# Patient Record
Sex: Female | Born: 1940 | Race: White | Hispanic: No | Smoking: Current every day smoker
Health system: Southern US, Community
[De-identification: ages and names within clinical notes are randomized; demographics above are authoritative.]

## PROBLEM LIST (undated history)

## (undated) DIAGNOSIS — I10 Essential (primary) hypertension: Secondary | ICD-10-CM

## (undated) DIAGNOSIS — E039 Hypothyroidism, unspecified: Secondary | ICD-10-CM

## (undated) DIAGNOSIS — I119 Hypertensive heart disease without heart failure: Secondary | ICD-10-CM

## (undated) DIAGNOSIS — I480 Paroxysmal atrial fibrillation: Secondary | ICD-10-CM

## (undated) DIAGNOSIS — I739 Peripheral vascular disease, unspecified: Secondary | ICD-10-CM

## (undated) DIAGNOSIS — E785 Hyperlipidemia, unspecified: Secondary | ICD-10-CM

## (undated) DIAGNOSIS — Z8719 Personal history of other diseases of the digestive system: Secondary | ICD-10-CM

## (undated) DIAGNOSIS — F419 Anxiety disorder, unspecified: Secondary | ICD-10-CM

## (undated) DIAGNOSIS — E119 Type 2 diabetes mellitus without complications: Secondary | ICD-10-CM

## (undated) HISTORY — DX: Hypertensive heart disease without heart failure: I11.9

## (undated) HISTORY — DX: Peripheral vascular disease, unspecified: I73.9

## (undated) HISTORY — PX: APPENDECTOMY: SHX54

## (undated) HISTORY — PX: RENAL ARTERY STENT: SHX2321

## (undated) HISTORY — PX: TOTAL KNEE ARTHROPLASTY: SHX125

## (undated) HISTORY — PX: ABDOMINAL HYSTERECTOMY: SHX81

## (undated) HISTORY — DX: Hyperlipidemia, unspecified: E78.5

## (undated) HISTORY — PX: CATARACT EXTRACTION W/ INTRAOCULAR LENS  IMPLANT, BILATERAL: SHX1307

## (undated) HISTORY — PX: JOINT REPLACEMENT: SHX530

---

## 1998-05-20 ENCOUNTER — Ambulatory Visit (HOSPITAL_COMMUNITY): Admission: RE | Admit: 1998-05-20 | Discharge: 1998-05-20 | Payer: Self-pay | Admitting: *Deleted

## 1998-09-24 ENCOUNTER — Emergency Department (HOSPITAL_COMMUNITY): Admission: EM | Admit: 1998-09-24 | Discharge: 1998-09-24 | Payer: Self-pay | Admitting: *Deleted

## 1999-01-12 ENCOUNTER — Ambulatory Visit (HOSPITAL_COMMUNITY): Admission: RE | Admit: 1999-01-12 | Discharge: 1999-01-12 | Payer: Self-pay | Admitting: Internal Medicine

## 1999-01-12 ENCOUNTER — Encounter: Payer: Self-pay | Admitting: Internal Medicine

## 2000-07-13 ENCOUNTER — Encounter: Payer: Self-pay | Admitting: *Deleted

## 2000-07-13 ENCOUNTER — Encounter: Admission: RE | Admit: 2000-07-13 | Discharge: 2000-07-13 | Payer: Self-pay | Admitting: *Deleted

## 2001-04-09 ENCOUNTER — Ambulatory Visit (HOSPITAL_COMMUNITY): Admission: RE | Admit: 2001-04-09 | Discharge: 2001-04-09 | Payer: Self-pay | Admitting: Internal Medicine

## 2001-04-09 ENCOUNTER — Encounter: Payer: Self-pay | Admitting: Internal Medicine

## 2001-07-03 ENCOUNTER — Ambulatory Visit (HOSPITAL_COMMUNITY): Admission: RE | Admit: 2001-07-03 | Discharge: 2001-07-03 | Payer: Self-pay | Admitting: *Deleted

## 2001-07-03 ENCOUNTER — Encounter (INDEPENDENT_AMBULATORY_CARE_PROVIDER_SITE_OTHER): Payer: Self-pay | Admitting: Specialist

## 2002-05-28 ENCOUNTER — Encounter: Admission: RE | Admit: 2002-05-28 | Discharge: 2002-08-26 | Payer: Self-pay | Admitting: Internal Medicine

## 2003-07-07 ENCOUNTER — Encounter: Admission: RE | Admit: 2003-07-07 | Discharge: 2003-07-07 | Payer: Self-pay | Admitting: Internal Medicine

## 2004-12-20 ENCOUNTER — Ambulatory Visit (HOSPITAL_COMMUNITY): Admission: RE | Admit: 2004-12-20 | Discharge: 2004-12-20 | Payer: Self-pay | Admitting: Cardiology

## 2005-01-03 ENCOUNTER — Observation Stay (HOSPITAL_COMMUNITY): Admission: RE | Admit: 2005-01-03 | Discharge: 2005-01-04 | Payer: Self-pay | Admitting: Interventional Cardiology

## 2006-03-13 ENCOUNTER — Encounter: Admission: RE | Admit: 2006-03-13 | Discharge: 2006-03-13 | Payer: Self-pay | Admitting: Internal Medicine

## 2006-04-24 ENCOUNTER — Emergency Department (HOSPITAL_COMMUNITY): Admission: EM | Admit: 2006-04-24 | Discharge: 2006-04-24 | Payer: Self-pay | Admitting: Emergency Medicine

## 2007-04-30 ENCOUNTER — Encounter: Admission: RE | Admit: 2007-04-30 | Discharge: 2007-04-30 | Payer: Self-pay | Admitting: Internal Medicine

## 2007-11-18 ENCOUNTER — Encounter: Admission: RE | Admit: 2007-11-18 | Discharge: 2007-11-18 | Payer: Self-pay | Admitting: Orthopedic Surgery

## 2007-11-28 ENCOUNTER — Ambulatory Visit (HOSPITAL_BASED_OUTPATIENT_CLINIC_OR_DEPARTMENT_OTHER): Admission: RE | Admit: 2007-11-28 | Discharge: 2007-11-28 | Payer: Self-pay | Admitting: Orthopedic Surgery

## 2008-05-13 ENCOUNTER — Encounter: Admission: RE | Admit: 2008-05-13 | Discharge: 2008-05-13 | Payer: Self-pay | Admitting: Internal Medicine

## 2008-07-03 ENCOUNTER — Encounter: Admission: RE | Admit: 2008-07-03 | Discharge: 2008-07-03 | Payer: Self-pay | Admitting: Internal Medicine

## 2009-06-04 ENCOUNTER — Encounter: Admission: RE | Admit: 2009-06-04 | Discharge: 2009-06-04 | Payer: Self-pay | Admitting: Internal Medicine

## 2009-08-23 ENCOUNTER — Inpatient Hospital Stay (HOSPITAL_COMMUNITY): Admission: RE | Admit: 2009-08-23 | Discharge: 2009-08-25 | Payer: Self-pay | Admitting: Orthopedic Surgery

## 2009-09-15 ENCOUNTER — Encounter: Admission: RE | Admit: 2009-09-15 | Discharge: 2009-12-13 | Payer: Self-pay | Admitting: Orthopedic Surgery

## 2009-10-26 ENCOUNTER — Encounter: Admission: RE | Admit: 2009-10-26 | Discharge: 2009-12-13 | Payer: Self-pay | Admitting: Orthopedic Surgery

## 2010-04-24 ENCOUNTER — Encounter: Payer: Self-pay | Admitting: Internal Medicine

## 2010-05-18 ENCOUNTER — Other Ambulatory Visit: Payer: Self-pay | Admitting: Internal Medicine

## 2010-05-18 DIAGNOSIS — Z1231 Encounter for screening mammogram for malignant neoplasm of breast: Secondary | ICD-10-CM

## 2010-06-06 ENCOUNTER — Ambulatory Visit
Admission: RE | Admit: 2010-06-06 | Discharge: 2010-06-06 | Disposition: A | Discharge status: Home / Self Care | Payer: Medicare Other | Source: Ambulatory Visit | Attending: Internal Medicine | Admitting: Internal Medicine

## 2010-06-06 DIAGNOSIS — Z1231 Encounter for screening mammogram for malignant neoplasm of breast: Secondary | ICD-10-CM

## 2010-06-20 LAB — DIFFERENTIAL
Eosinophils Absolute: 0.1 10*3/uL (ref 0.0–0.7)
Eosinophils Relative: 1 % (ref 0–5)
Lymphocytes Relative: 24 % (ref 12–46)
Lymphs Abs: 2.2 10*3/uL (ref 0.7–4.0)
Monocytes Relative: 5 % (ref 3–12)

## 2010-06-20 LAB — CBC
HCT: 30.8 % — ABNORMAL LOW (ref 36.0–46.0)
HCT: 37.9 % (ref 36.0–46.0)
Hemoglobin: 13 g/dL (ref 12.0–15.0)
MCHC: 34.4 g/dL (ref 30.0–36.0)
MCHC: 34.8 g/dL (ref 30.0–36.0)
MCV: 96.3 fL (ref 78.0–100.0)
Platelets: 109 10*3/uL — ABNORMAL LOW (ref 150–400)
Platelets: 68 10*3/uL — ABNORMAL LOW (ref 150–400)
RBC: 3.94 MIL/uL (ref 3.87–5.11)
RDW: 12.7 % (ref 11.5–15.5)
RDW: 12.8 % (ref 11.5–15.5)
WBC: 10 10*3/uL (ref 4.0–10.5)
WBC: 10.3 10*3/uL (ref 4.0–10.5)

## 2010-06-20 LAB — BASIC METABOLIC PANEL
BUN: 14 mg/dL (ref 6–23)
CO2: 27 mEq/L (ref 19–32)
Chloride: 105 mEq/L (ref 96–112)
Creatinine, Ser: 0.93 mg/dL (ref 0.4–1.2)
Creatinine, Ser: 0.99 mg/dL (ref 0.4–1.2)
GFR calc Af Amer: >60 mL/min (ref >60)
GFR calc non Af Amer: 60 mL/min — ABNORMAL LOW (ref >60)
Glucose, Bld: 142 mg/dL — ABNORMAL HIGH (ref 70–99)
Potassium: 3.5 mEq/L (ref 3.5–5.1)
Potassium: 3.5 mEq/L (ref 3.5–5.1)

## 2010-06-20 LAB — CROSSMATCH
ABO/RH(D): A POS
Antibody Screen: NEGATIVE

## 2010-06-20 LAB — URINALYSIS, ROUTINE W REFLEX MICROSCOPIC
Bilirubin Urine: NEGATIVE
Bilirubin Urine: NEGATIVE
Bilirubin Urine: NEGATIVE
Glucose, UA: NEGATIVE mg/dL
Glucose, UA: NEGATIVE mg/dL
Glucose, UA: NEGATIVE mg/dL
Hgb urine dipstick: NEGATIVE
Hgb urine dipstick: NEGATIVE
Ketones, ur: NEGATIVE mg/dL
Ketones, ur: NEGATIVE mg/dL
Nitrite: NEGATIVE
Nitrite: NEGATIVE
Nitrite: POSITIVE — AB
Protein, ur: 100 mg/dL — AB
Protein, ur: NEGATIVE mg/dL
Specific Gravity, Urine: 1.006 (ref 1.005–1.030)
Specific Gravity, Urine: 1.009 (ref 1.005–1.030)
Specific Gravity, Urine: 1.023 (ref 1.005–1.030)
Urobilinogen, UA: 0.2 mg/dL (ref 0.0–1.0)
Urobilinogen, UA: 0.2 mg/dL (ref 0.0–1.0)
pH: 6 (ref 5.0–8.0)
pH: 6.5 (ref 5.0–8.0)
pH: 7 (ref 5.0–8.0)

## 2010-06-20 LAB — COMPREHENSIVE METABOLIC PANEL
ALT: 21 U/L (ref 0–35)
AST: 26 U/L (ref 0–37)
Calcium: 10.6 mg/dL — ABNORMAL HIGH (ref 8.4–10.5)
GFR calc Af Amer: >60 mL/min (ref >60)
Sodium: 137 mEq/L (ref 135–145)
Total Protein: 7.5 g/dL (ref 6.0–8.3)

## 2010-06-20 LAB — GLUCOSE, CAPILLARY
Glucose-Capillary: 140 mg/dL — ABNORMAL HIGH (ref 70–99)
Glucose-Capillary: 165 mg/dL — ABNORMAL HIGH (ref 70–99)
Glucose-Capillary: 206 mg/dL — ABNORMAL HIGH (ref 70–99)
Glucose-Capillary: 218 mg/dL — ABNORMAL HIGH (ref 70–99)
Glucose-Capillary: 219 mg/dL — ABNORMAL HIGH (ref 70–99)
Glucose-Capillary: 240 mg/dL — ABNORMAL HIGH (ref 70–99)
Glucose-Capillary: 248 mg/dL — ABNORMAL HIGH (ref 70–99)

## 2010-06-20 LAB — URINE CULTURE
Colony Count: >=100000
Colony Count: NO GROWTH
Culture: NO GROWTH

## 2010-06-20 LAB — URINE MICROSCOPIC-ADD ON

## 2010-06-20 LAB — ABO/RH: ABO/RH(D): A POS

## 2010-06-20 LAB — PROTIME-INR: INR: 0.98 (ref 0.00–1.49)

## 2010-08-16 NOTE — Op Note (Signed)
NAME:  Krista Arroyo, Krista Arroyo            ACCOUNT NO.:  [id number]   MEDICAL RECORD NO.:  [id number]          PATIENT TYPE:  AMB   LOCATION:  DSC                          FACILITY:  [facility]   PHYSICIAN:  Rodney A. Mortenson, M.D.DATE OF BIRTH:  December 30, 1940   DATE OF PROCEDURE:  11/28/2007  DATE OF DISCHARGE:                               OPERATIVE REPORT   JUSTIFICATION:  A 70 year old female with a 6-to 8-week history of right  knee pain.  She has pain along the medial side of the knee.  Twisting of  leg causes pain about the medial side of the tibia.  Examination shows  1+ effusion.  There is slight tenderness of the medial joint line.  No  lateral joint line tenderness.  Forced extension was quite painful.  X-  ray showed some narrowing of the lateral joint line and calcification of  the popliteal vessels.  An MRI was done that shows an extremely complex  tear of the posterior two-thirds of the lateral meniscus with a flap of  lateral meniscus in the intercondylar notch area.  Because persistent  pain and discomfort, she is now admitted for arthroscopic evaluation and  treatment.  Complications were discussed preoperatively.  Questions  answered and encouraged and the patient wished to proceed.   JUSTIFICATION OUTPATIENT SURGERY:  Minimal morbidity.   PREOPERATIVE DIAGNOSES:  Early osteoarthritis of lateral compartment,  right knee; complex tear posterior two-thirds of lateral meniscus, right  knee.   POSTOPERATIVE DIAGNOSES:  Early osteoarthritis of lateral compartment,  right knee; complex tear posterior two-thirds of lateral meniscus, right  knee; and early osteoarthritis of lateral patellar facet, right knee.   OPERATION:  Arthroscopy of right knee; subtotal lateral meniscectomy.   SURGEON:  Lenard Galloway. Chaney Malling, MD   ANESTHESIA:  MAC.   PATHOLOGY:  With the arthroscope of the knee, a very careful examination  of the knee was undertaken.  There is a fair amount of synovitis in  the  superior pouch.  The lateral patellar facet showed loss of articular  cartilage.  The femoral notch area appeared fairly normal.  The anterior  cruciate was normal, but there is a big flap of the lateral meniscus  which was jammed into the intercondylar notch area.  In the medial  compartments, there was normal articular cartilage over the medial  femoral condyle and medial tibial plateau.  There was some cyrstal  deposition and not diagnostic of gout.  In any case, the medial meniscus  was absolutely normal articular cartilage and medial compartment was  normal.  The arthroscope was then placed in the lateral compartment and  had extensive tearing of the lateral meniscus starting at the junction  of the anterior mid third and carried all way posteriorly.   PROCEDURE:  The patient was placed on the operating table in supine  position with the pneumatic tourniquet above the right upper thigh.  The  right leg was placed in a leg holder.  The entire right lower extremity  was prepped with DuraPrep and draped out in the usual manner.  An  infusion cannula was placed in the superior medial  pouch and the knee  distended with saline.  Anteromedial and anterolateral portals were made  and the arthroscope was introduced.  The findings as described above.  The only significant pathology that was treatable was in the lateral  compartment.  Through both medial and lateral portals, a series of  baskets were inserted and this was very aggressively debrided.  Once  this was debulked back to more stable meniscal tissue, the intra-  articular shaver was introduced.  All debriders were removed.  The  remaining rim was then smoothed and balanced and the posterior two-  thirds of the meniscus and about half the width had to be removed.  Transition to the anterior third was smooth.  The meniscal rim was quite  stable and the anterior third of lateral meniscus was preserved.  Marcaine was then placed and  a large bulky pressure dressing applied and  the patient returned to the recovery room in excellent condition.  Technically, this procedure went extremely well.   DISPOSITION:  1. Percocet for pain.  2. Usual postoperative instructions were given.  3. Return to my office on Wednesday for followup exam.      Thereasa Distance A. Chaney Malling, M.D.  Electronically Signed     RAM/MEDQ  D:  11/28/2007  T:  11/29/2007  Job:  [id number]

## 2010-08-19 NOTE — Discharge Summary (Signed)
NAME:  Krista Arroyo, BIRKEL            ACCOUNT NO.:  [id number]   MEDICAL RECORD NO.:  [id number]          PATIENT TYPE:  OBV   LOCATION:  6533                         FACILITY:  [facility]   PHYSICIAN:  Corky Crafts, MDDATE OF BIRTH:  1940-05-04   DATE OF ADMISSION:  01/03/2005  DATE OF DISCHARGE:  01/04/2005                                 DISCHARGE SUMMARY   ADDENDUM:  I spent 45 minutes doing this discharge.  The majority of time  spent was with the patient going over her medications and follow-up  instructions.           ______________________________  Corky Crafts, MD     JSV/MEDQ  D:  01/05/2005  T:  01/05/2005  Job:  [id number]

## 2010-08-19 NOTE — Op Note (Signed)
NAME:  Krista Arroyo, BLOMGREN NO.:  [id number]   MEDICAL RECORD NO.:  [id number]          PATIENT TYPE:  OUT   LOCATION:  VASC                         FACILITY:  [facility]   PHYSICIAN:  Corky Crafts, MDDATE OF BIRTH:  [birth year]   DATE OF PROCEDURE:  01/03/2005  DATE OF DISCHARGE:                                 OPERATIVE REPORT   PROCEDURE:  1.  Abdominal aortogram.  2.  Bilateral selective renal angiogram.  3.  Right renal artery PTCA with stent placement.   SURGEON:  Corky Crafts, M.D.  and Salvadore Farber, M.D. [facility].   INDICATIONS FOR PROCEDURE:  The patient is a 70 year old woman who is on  five medications for hypertension which is still poorly controlled.  She had  an abnormal MRA showing bilateral renal artery stenosis.   DESCRIPTION OF PROCEDURE:  The patient was brought to the peripheral  vascular lab.  The risks and alternatives of the procedure in consultation  were explained to the patient and informed consent was obtained.  The  patient was placed on the table.  The planned puncture sites were prepped  and draped in the usual sterile fashion.   1.  Left femoral artery access.  The puncture site was infiltrated with      lidocaine.  The vessel was accessed.  The wire was threaded into the      vessel and a 6 French sheath was advanced over the wire into the vessel.   1.  Abdominal aortogram.  A pigtail catheter was positioned under      fluoroscopic guidance within the abdominal aorta.  Contrast injections      were performed using power injection of contrast.  An angiogram was      obtained in the AP projection.   1.  Bilateral selective renal angiogram.  A 55 cm LIMA diagnostic catheter      was advanced through the aorta and positioned in the left renal artery      ostium under fluoroscopic guidance.  Angiography was performed using      hand injection of contrast.   1.  Right renal angiography.  A 55 cm LIMA diagnostic catheter was     positioned in the right renal ostium under fluoroscopic guidance.      Angiography was performed using hand injection of contrast.   LESION #1 INTERVENTION:  A stabilizer wire was advanced across the 95%  proximal right renal artery lesion while the 0.035 inch J-wire was also  within the guide catheter.  The lesion was crossed, the J-wire was removed,  and the guide catheter was slowly advanced into the right renal artery  vessel ostium.  The lesion was predilated with a 4.0 x 15 Aviator balloon to  7 atmospheres for 30 seconds.  There was still a significant residual  stenosis in the right renal artery.  A 4.5 mm x 12 Herculink stent was then  advanced to the lesion and inflated at 12 atmospheres for 40 seconds.  The  ostium of the stent which was hanging in the aorta slightly was then flared  with an  inflation of 8 atmospheres for 15 seconds.   The stent was then postdilated with a 6.0 x 15 mm Aviator balloon at 7  atmospheres for 30 seconds.  The patient began to experience some slight  flank pain at this level of inflation and higher pressures were not used.   HEMODYNAMICS:  Central aortic pressure was 191/84 during the procedure.   IMPRESSION:  1.  Severe atherosclerosis of the abdominal aorta.  There is also moderate      atherosclerosis of the right common iliac noted at the bifurcation which      did not seem to exceed greater than 40%. There was mild disease of the      left common iliac.  There is a dual arterial supply to the right kidney      and single artery supply to the left kidney.  2.  Selective bilateral renal angiogram shows a 95% calcified proximal      stenosis in the right renal artery supplying the upper 3/4 of the kidney      with post stenotic dilatation.  The accessory right renal artery      supplying the lower part of the kidney was patent per the abdominal      aortogram.  3.  40% ostial left renal artery lesion.  4.  Successful percutaneous  transluminal coronary angioplasty with stent      placement of the right renal artery with normal flow and a residual 10%      stenosis.   RECOMMENDATIONS:  We will continue aspirin and Plavix in this patient for at  least a month.  We will observe the patient overnight and watch her blood  pressure medicines as she may require lower dosages after this procedure.  There were no apparent complications.           ______________________________  Corky Crafts, MD     JSV/MEDQ  D:  01/03/2005  T:  01/03/2005  Job:  [id number]

## 2010-08-19 NOTE — Discharge Summary (Signed)
NAME:  Krista Arroyo, Krista Arroyo            ACCOUNT NO.:  [id number]   MEDICAL RECORD NO.:  [id number]          PATIENT TYPE:  OBV   LOCATION:  6533                         FACILITY:  [facility]   PHYSICIAN:  Corky Crafts, MDDATE OF BIRTH:  April 29, 1940   DATE OF ADMISSION:  01/03/2005  DATE OF DISCHARGE:  01/04/2005                                 DISCHARGE SUMMARY   DISCHARGE DIAGNOSES:  1.  Renal artery stenosis, status post right renal artery stent placement.  2.  Hypertension.  3.  Peripheral vascular disease.   PROCEDURES:  Abdominal aortogram, bilateral selective renal angiogram, right  renal artery percutaneous transluminal coronary angioplasty/stent.   HOSPITAL COURSE:  The patient underwent peripheral angiography with  intervention on January 03, 2005.  She had a 4.5 X 12 Herculink stent placed  in her right renal artery after angiography.  The procedure went well.  There were no apparent complications.  The patient was observed overnight.  Her blood pressures were noted to be anywhere from 140's to 150's systolic  which is a marked improvement for her.  She did not have any issues with  bleeding post procedure.  She did have a vagal response during the sheath  pull, however, this resolved quickly after 0.5 mg of Atropine.  On the  morning of the procedure the patient feels well and is ready to go home.   DISCHARGE MEDICATIONS:  1.  Glyburide 1.25 mg p.o. three times a day for today only.  Tomorrow,      January 05, 2005, she will resume her Glimetformin 1.25/250 mg p.o. three      times a day.  2.  Today she will also take Altace 10 mg p.o. twice a day.  3.  Norvasc 10 mg p.o. daily.  4.  Lipitor 40 mg p.o. once daily.  5.  Toprol XL 200 mg once daily.  6.  Aspirin 325 mg p.o. daily.  7.  Plavix 75 mg p.o. daily for one month.  8.  Will stop her Avalide at this time since he blood pressure is improved.   DISCHARGE INSTRUCTIONS:   DIET:  The patient is to follow a low salt  diet.   ACTIVITY:  She is not to drive for three days.  She is not to do any heavy  lifting for at least one week.  She is to increase her activity slowly.   FOLLOW UP:  She has a follow up appointment with Dr. Eldridge Dace in the  cardiology office on January 09, 2005 at 9:50 A.M.           ______________________________  Corky Crafts, MD     JSV/MEDQ  D:  01/04/2005  T:  01/04/2005  Job:  [id number]

## 2010-11-16 ENCOUNTER — Ambulatory Visit: Payer: Medicare Other | Attending: Orthopedic Surgery | Admitting: Physical Therapy

## 2010-11-16 DIAGNOSIS — M25559 Pain in unspecified hip: Secondary | ICD-10-CM | POA: Insufficient documentation

## 2010-11-16 DIAGNOSIS — M545 Low back pain, unspecified: Secondary | ICD-10-CM | POA: Insufficient documentation

## 2010-11-16 DIAGNOSIS — IMO0001 Reserved for inherently not codable concepts without codable children: Secondary | ICD-10-CM | POA: Insufficient documentation

## 2010-11-21 ENCOUNTER — Ambulatory Visit: Payer: Medicare Other | Admitting: Physical Therapy

## 2010-11-24 ENCOUNTER — Ambulatory Visit: Payer: Medicare Other | Admitting: Physical Therapy

## 2010-11-30 ENCOUNTER — Ambulatory Visit: Payer: Medicare Other | Admitting: Physical Therapy

## 2010-12-06 ENCOUNTER — Ambulatory Visit: Payer: Medicare Other | Attending: Orthopedic Surgery | Admitting: Physical Therapy

## 2010-12-06 DIAGNOSIS — IMO0001 Reserved for inherently not codable concepts without codable children: Secondary | ICD-10-CM | POA: Insufficient documentation

## 2010-12-06 DIAGNOSIS — M25559 Pain in unspecified hip: Secondary | ICD-10-CM | POA: Insufficient documentation

## 2010-12-06 DIAGNOSIS — M545 Low back pain, unspecified: Secondary | ICD-10-CM | POA: Insufficient documentation

## 2010-12-13 ENCOUNTER — Ambulatory Visit: Payer: Medicare Other | Admitting: Physical Therapy

## 2010-12-15 ENCOUNTER — Ambulatory Visit: Payer: Medicare Other | Admitting: Physical Therapy

## 2010-12-19 ENCOUNTER — Ambulatory Visit: Payer: Medicare Other | Admitting: Physical Therapy

## 2010-12-21 ENCOUNTER — Ambulatory Visit: Payer: Medicare Other | Admitting: Physical Therapy

## 2010-12-27 ENCOUNTER — Ambulatory Visit: Payer: Medicare Other | Admitting: Physical Therapy

## 2010-12-29 ENCOUNTER — Encounter: Payer: Medicare Other | Admitting: Physical Therapy

## 2011-01-04 ENCOUNTER — Ambulatory Visit: Payer: Medicare Other | Attending: Orthopedic Surgery | Admitting: Physical Therapy

## 2011-01-04 DIAGNOSIS — IMO0001 Reserved for inherently not codable concepts without codable children: Secondary | ICD-10-CM | POA: Insufficient documentation

## 2011-01-04 DIAGNOSIS — M25559 Pain in unspecified hip: Secondary | ICD-10-CM | POA: Insufficient documentation

## 2011-01-04 DIAGNOSIS — M545 Low back pain, unspecified: Secondary | ICD-10-CM | POA: Insufficient documentation

## 2011-06-05 ENCOUNTER — Other Ambulatory Visit: Payer: Self-pay | Admitting: Internal Medicine

## 2011-06-05 DIAGNOSIS — Z1231 Encounter for screening mammogram for malignant neoplasm of breast: Secondary | ICD-10-CM

## 2011-06-20 ENCOUNTER — Ambulatory Visit
Admission: RE | Admit: 2011-06-20 | Discharge: 2011-06-20 | Disposition: A | Discharge status: Home / Self Care | Payer: BC Managed Care – PPO | Source: Ambulatory Visit | Attending: Internal Medicine | Admitting: Internal Medicine

## 2011-06-20 DIAGNOSIS — Z1231 Encounter for screening mammogram for malignant neoplasm of breast: Secondary | ICD-10-CM

## 2011-10-11 ENCOUNTER — Other Ambulatory Visit: Payer: Self-pay | Admitting: Internal Medicine

## 2011-10-11 DIAGNOSIS — IMO0001 Reserved for inherently not codable concepts without codable children: Secondary | ICD-10-CM

## 2011-11-01 ENCOUNTER — Other Ambulatory Visit: Payer: Medicare Other

## 2011-11-15 ENCOUNTER — Ambulatory Visit
Admission: RE | Admit: 2011-11-15 | Discharge: 2011-11-15 | Disposition: A | Discharge status: Home / Self Care | Payer: Medicare Other | Source: Ambulatory Visit | Attending: Internal Medicine | Admitting: Internal Medicine

## 2011-11-15 DIAGNOSIS — IMO0001 Reserved for inherently not codable concepts without codable children: Secondary | ICD-10-CM

## 2012-02-01 ENCOUNTER — Other Ambulatory Visit: Payer: Self-pay | Admitting: Internal Medicine

## 2012-02-01 DIAGNOSIS — E049 Nontoxic goiter, unspecified: Secondary | ICD-10-CM

## 2012-02-07 ENCOUNTER — Ambulatory Visit
Admission: RE | Admit: 2012-02-07 | Discharge: 2012-02-07 | Disposition: A | Discharge status: Home / Self Care | Payer: Medicare Other | Source: Ambulatory Visit | Attending: Internal Medicine | Admitting: Internal Medicine

## 2012-02-07 DIAGNOSIS — E049 Nontoxic goiter, unspecified: Secondary | ICD-10-CM

## 2012-07-31 ENCOUNTER — Other Ambulatory Visit: Payer: Self-pay

## 2012-07-31 DIAGNOSIS — Z1231 Encounter for screening mammogram for malignant neoplasm of breast: Secondary | ICD-10-CM

## 2012-08-16 ENCOUNTER — Ambulatory Visit
Admission: RE | Admit: 2012-08-16 | Discharge: 2012-08-16 | Disposition: A | Discharge status: Home / Self Care | Payer: Medicare Other | Source: Ambulatory Visit

## 2012-08-16 DIAGNOSIS — Z1231 Encounter for screening mammogram for malignant neoplasm of breast: Secondary | ICD-10-CM

## 2013-03-21 ENCOUNTER — Other Ambulatory Visit: Payer: Self-pay | Admitting: Internal Medicine

## 2013-03-21 DIAGNOSIS — E042 Nontoxic multinodular goiter: Secondary | ICD-10-CM

## 2013-04-01 ENCOUNTER — Ambulatory Visit
Admission: RE | Admit: 2013-04-01 | Discharge: 2013-04-01 | Disposition: A | Discharge status: Home / Self Care | Payer: Medicare Other | Source: Ambulatory Visit | Attending: Internal Medicine | Admitting: Internal Medicine

## 2013-04-01 DIAGNOSIS — E042 Nontoxic multinodular goiter: Secondary | ICD-10-CM

## 2014-08-23 ENCOUNTER — Encounter (HOSPITAL_COMMUNITY): Payer: Self-pay | Admitting: *Deleted

## 2014-08-23 ENCOUNTER — Emergency Department (HOSPITAL_COMMUNITY): Payer: Medicare Other

## 2014-08-23 ENCOUNTER — Inpatient Hospital Stay: Admit: 2014-08-23 | Payer: Medicare Other | Admitting: Orthopedic Surgery

## 2014-08-23 ENCOUNTER — Inpatient Hospital Stay (HOSPITAL_COMMUNITY)
Admission: EM | Admit: 2014-08-23 | Discharge: 2014-08-24 | DRG: 603 | Disposition: A | Discharge status: Home / Self Care | Payer: Medicare Other | Attending: Internal Medicine | Admitting: Internal Medicine

## 2014-08-23 ENCOUNTER — Encounter (HOSPITAL_COMMUNITY)
Admission: EM | Disposition: A | Discharge status: Home / Self Care | Payer: Self-pay | Source: Home / Self Care | Attending: Internal Medicine

## 2014-08-23 DIAGNOSIS — N179 Acute kidney failure, unspecified: Secondary | ICD-10-CM | POA: Diagnosis present

## 2014-08-23 DIAGNOSIS — Z794 Long term (current) use of insulin: Secondary | ICD-10-CM

## 2014-08-23 DIAGNOSIS — E039 Hypothyroidism, unspecified: Secondary | ICD-10-CM | POA: Diagnosis present

## 2014-08-23 DIAGNOSIS — F17210 Nicotine dependence, cigarettes, uncomplicated: Secondary | ICD-10-CM | POA: Diagnosis present

## 2014-08-23 DIAGNOSIS — L02511 Cutaneous abscess of right hand: Secondary | ICD-10-CM

## 2014-08-23 DIAGNOSIS — L03011 Cellulitis of right finger: Secondary | ICD-10-CM | POA: Diagnosis not present

## 2014-08-23 DIAGNOSIS — E1165 Type 2 diabetes mellitus with hyperglycemia: Secondary | ICD-10-CM | POA: Diagnosis present

## 2014-08-23 DIAGNOSIS — E871 Hypo-osmolality and hyponatremia: Secondary | ICD-10-CM | POA: Diagnosis present

## 2014-08-23 DIAGNOSIS — IMO0002 Reserved for concepts with insufficient information to code with codable children: Secondary | ICD-10-CM | POA: Diagnosis present

## 2014-08-23 DIAGNOSIS — M7989 Other specified soft tissue disorders: Secondary | ICD-10-CM | POA: Diagnosis not present

## 2014-08-23 DIAGNOSIS — I10 Essential (primary) hypertension: Secondary | ICD-10-CM | POA: Diagnosis present

## 2014-08-23 DIAGNOSIS — Z79899 Other long term (current) drug therapy: Secondary | ICD-10-CM

## 2014-08-23 DIAGNOSIS — D696 Thrombocytopenia, unspecified: Secondary | ICD-10-CM | POA: Diagnosis present

## 2014-08-23 DIAGNOSIS — E860 Dehydration: Secondary | ICD-10-CM | POA: Diagnosis present

## 2014-08-23 HISTORY — PX: I&D EXTREMITY: SHX5045

## 2014-08-23 HISTORY — DX: Essential (primary) hypertension: I10

## 2014-08-23 LAB — URINALYSIS, ROUTINE W REFLEX MICROSCOPIC
Bilirubin Urine: NEGATIVE
Glucose, UA: >1000 mg/dL — AB
Hgb urine dipstick: NEGATIVE
KETONES UR: NEGATIVE mg/dL
LEUKOCYTES UA: NEGATIVE
Nitrite: NEGATIVE
Protein, ur: NEGATIVE mg/dL
Specific Gravity, Urine: 1.021 (ref 1.005–1.030)
UROBILINOGEN UA: 0.2 mg/dL (ref 0.0–1.0)
pH: 6 (ref 5.0–8.0)

## 2014-08-23 LAB — CBC WITH DIFFERENTIAL/PLATELET
BASOS ABS: 0 10*3/uL (ref 0.0–0.1)
BASOS PCT: 0 % (ref 0–1)
Eosinophils Absolute: 0.1 10*3/uL (ref 0.0–0.7)
Eosinophils Relative: 1 % (ref 0–5)
HCT: 46.7 % — ABNORMAL HIGH (ref 36.0–46.0)
Hemoglobin: 16.6 g/dL — ABNORMAL HIGH (ref 12.0–15.0)
LYMPHS PCT: 26 % (ref 12–46)
Lymphs Abs: 1.6 10*3/uL (ref 0.7–4.0)
MCH: 32.7 pg (ref 26.0–34.0)
MCHC: 35.5 g/dL (ref 30.0–36.0)
MCV: 92.1 fL (ref 78.0–100.0)
MONOS PCT: 11 % (ref 3–12)
Monocytes Absolute: 0.7 10*3/uL (ref 0.1–1.0)
NEUTROS ABS: 3.6 10*3/uL (ref 1.7–7.7)
Neutrophils Relative %: 62 % (ref 43–77)
Platelets: 78 10*3/uL — ABNORMAL LOW (ref 150–400)
RBC: 5.07 MIL/uL (ref 3.87–5.11)
RDW: 12.6 % (ref 11.5–15.5)
WBC: 6 10*3/uL (ref 4.0–10.5)

## 2014-08-23 LAB — BASIC METABOLIC PANEL
Anion gap: 13 (ref 5–15)
BUN: 27 mg/dL — AB (ref 6–20)
CO2: 21 mmol/L — ABNORMAL LOW (ref 22–32)
CREATININE: 1.23 mg/dL — AB (ref 0.44–1.00)
Calcium: 10.4 mg/dL — ABNORMAL HIGH (ref 8.9–10.3)
Chloride: 97 mmol/L — ABNORMAL LOW (ref 101–111)
GFR, EST AFRICAN AMERICAN: 49 mL/min — AB (ref >60)
GFR, EST NON AFRICAN AMERICAN: 42 mL/min — AB (ref >60)
GLUCOSE: 372 mg/dL — AB (ref 65–99)
POTASSIUM: 4.9 mmol/L (ref 3.5–5.1)
SODIUM: 131 mmol/L — AB (ref 135–145)

## 2014-08-23 LAB — URINE MICROSCOPIC-ADD ON

## 2014-08-23 SURGERY — IRRIGATION AND DEBRIDEMENT EXTREMITY
Anesthesia: General | Site: Thumb | Laterality: Right

## 2014-08-23 SURGERY — IRRIGATION AND DEBRIDEMENT EXTREMITY
Anesthesia: General | Laterality: Right

## 2014-08-23 MED ORDER — SODIUM CHLORIDE 0.9 % IV BOLUS (SEPSIS)
1000.0000 mL | Freq: Once | INTRAVENOUS | Status: AC
  Administered 2014-08-23: 1000 mL via INTRAVENOUS

## 2014-08-23 MED ORDER — PIPERACILLIN-TAZOBACTAM 3.375 G IVPB: 3.3750 g | Freq: Three times a day (TID) | INTRAVENOUS | Status: DC

## 2014-08-23 MED ORDER — FENTANYL CITRATE (PF) 250 MCG/5ML IJ SOLN
INTRAMUSCULAR | Status: AC
  Filled 2014-08-23: qty 5

## 2014-08-23 MED ORDER — SODIUM CHLORIDE 0.9 % IV SOLN
INTRAVENOUS | Status: DC | PRN
  Administered 2014-08-23: via INTRAVENOUS

## 2014-08-23 MED ORDER — PIPERACILLIN-TAZOBACTAM 3.375 G IVPB 30 MIN
3.3750 g | INTRAVENOUS | Status: DC
  Filled 2014-08-23: qty 50

## 2014-08-23 MED ORDER — SUCCINYLCHOLINE CHLORIDE 20 MG/ML IJ SOLN
INTRAMUSCULAR | Status: AC
  Filled 2014-08-23: qty 1

## 2014-08-23 MED ORDER — HYDROCODONE-ACETAMINOPHEN 5-325 MG PO TABS
2.0000 | ORAL_TABLET | Freq: Once | ORAL | Status: AC
  Administered 2014-08-23: 2 via ORAL
  Filled 2014-08-23: qty 2

## 2014-08-23 MED ORDER — MIDAZOLAM HCL 2 MG/2ML IJ SOLN
INTRAMUSCULAR | Status: AC
  Filled 2014-08-23: qty 2

## 2014-08-23 MED ORDER — VANCOMYCIN HCL IN DEXTROSE 1-5 GM/200ML-% IV SOLN
1000.0000 mg | INTRAVENOUS | Status: DC
  Filled 2014-08-23: qty 200

## 2014-08-23 MED ORDER — ONDANSETRON HCL 4 MG/2ML IJ SOLN
INTRAMUSCULAR | Status: AC
  Filled 2014-08-23: qty 2

## 2014-08-23 MED ORDER — PROPOFOL 10 MG/ML IV BOLUS
INTRAVENOUS | Status: AC
  Filled 2014-08-23: qty 20

## 2014-08-23 MED ORDER — LIDOCAINE HCL (CARDIAC) 20 MG/ML IV SOLN
INTRAVENOUS | Status: AC
  Filled 2014-08-23: qty 5

## 2014-08-23 MED ORDER — VANCOMYCIN HCL IN DEXTROSE 1-5 GM/200ML-% IV SOLN: 1000.0000 mg | INTRAVENOUS | Status: DC

## 2014-08-23 MED ORDER — ROCURONIUM BROMIDE 50 MG/5ML IV SOLN
INTRAVENOUS | Status: AC
  Filled 2014-08-23: qty 1

## 2014-08-23 SURGICAL SUPPLY — 34 items
BNDG CMPR 9X4 STRL LF SNTH (GAUZE/BANDAGES/DRESSINGS)
BNDG COHESIVE 1X5 TAN STRL LF (GAUZE/BANDAGES/DRESSINGS) ×3 IMPLANT
BNDG ESMARK 4X9 LF (GAUZE/BANDAGES/DRESSINGS) IMPLANT
BNDG GAUZE ELAST 4 BULKY (GAUZE/BANDAGES/DRESSINGS) ×3 IMPLANT
CORDS BIPOLAR (ELECTRODE) ×3 IMPLANT
COVER SURGICAL LIGHT HANDLE (MISCELLANEOUS) ×5 IMPLANT
GAUZE PACKING IODOFORM 1/4X15 (GAUZE/BANDAGES/DRESSINGS) ×3 IMPLANT
GAUZE SPONGE 4X4 12PLY STRL (GAUZE/BANDAGES/DRESSINGS) ×3 IMPLANT
GAUZE XEROFORM 1X8 LF (GAUZE/BANDAGES/DRESSINGS) ×3 IMPLANT
GLOVE BIO SURGEON STRL SZ7.5 (GLOVE) ×3 IMPLANT
GLOVE BIOGEL PI IND STRL 8 (GLOVE) ×1 IMPLANT
GLOVE BIOGEL PI INDICATOR 8 (GLOVE) ×2
GOWN STRL REUS W/ TWL LRG LVL3 (GOWN DISPOSABLE) ×1 IMPLANT
GOWN STRL REUS W/TWL LRG LVL3 (GOWN DISPOSABLE) ×3
KIT BASIN OR (CUSTOM PROCEDURE TRAY) ×3 IMPLANT
KIT ROOM TURNOVER OR (KITS) ×3 IMPLANT
NDL HYPO 25X1 1.5 SAFETY (NEEDLE) IMPLANT
NEEDLE HYPO 25X1 1.5 SAFETY (NEEDLE) IMPLANT
NS IRRIG 1000ML POUR BTL (IV SOLUTION) ×3 IMPLANT
PACK ORTHO EXTREMITY (CUSTOM PROCEDURE TRAY) ×3 IMPLANT
PAD ARMBOARD 7.5X6 YLW CONV (MISCELLANEOUS) ×6 IMPLANT
SCRUB BETADINE 4OZ XXX (MISCELLANEOUS) ×3 IMPLANT
SOLUTION BETADINE 4OZ (MISCELLANEOUS) ×3 IMPLANT
SPONGE GAUZE 4X4 12PLY STER LF (GAUZE/BANDAGES/DRESSINGS) ×3 IMPLANT
SPONGE LAP 18X18 X RAY DECT (DISPOSABLE) ×3 IMPLANT
SPONGE LAP 4X18 X RAY DECT (DISPOSABLE) ×3 IMPLANT
SUCTION FRAZIER TIP 10 FR DISP (SUCTIONS) ×3 IMPLANT
SYR CONTROL 10ML LL (SYRINGE) IMPLANT
TOWEL OR 17X24 6PK STRL BLUE (TOWEL DISPOSABLE) ×3 IMPLANT
TOWEL OR 17X26 10 PK STRL BLUE (TOWEL DISPOSABLE) ×3 IMPLANT
TUBE ANAEROBIC SPECIMEN COL (MISCELLANEOUS) IMPLANT
TUBE CONNECTING 12'X1/4 (SUCTIONS) ×1
TUBE CONNECTING 12X1/4 (SUCTIONS) ×2 IMPLANT
UNDERPAD 30X30 INCONTINENT (UNDERPADS AND DIAPERS) ×3 IMPLANT

## 2014-08-23 NOTE — Anesthesia Preprocedure Evaluation (Addendum)
Anesthesia Evaluation  Patient identified by MRN, date of birth, ID band Patient awake    Reviewed: Allergy & Precautions, NPO status , Patient's Chart, lab work & pertinent test resultsPreop documentation limited or incomplete due to emergent nature of procedure.  History of Anesthesia Complications Negative for: history of anesthetic complications  Airway Mallampati: II  TM Distance: >3 FB Neck ROM: Full    Dental  (+) Poor Dentition, Dental Advisory Given   Pulmonary COPDCurrent Smoker,  breath sounds clear to auscultation        Cardiovascular hypertension, Pt. on medications and Pt. on home beta blockers - anginaRhythm:Regular Rate:Normal     Neuro/Psych negative neurological ROS     GI/Hepatic negative GI ROS, Neg liver ROS,   Endo/Other  diabetes (glu 372), Insulin DependentHypothyroidism   Renal/GU negative Renal ROS     Musculoskeletal   Abdominal   Peds  Hematology  (+) Blood dyscrasia (plt 78K), ,   Anesthesia Other Findings   Reproductive/Obstetrics                           Anesthesia Physical Anesthesia Plan  ASA: III  Anesthesia Plan: General   Post-op Pain Management:    Induction: Intravenous  Airway Management Planned: LMA  Additional Equipment:   Intra-op Plan:   Post-operative Plan:   Informed Consent: I have reviewed the patients History and Physical, chart, labs and discussed the procedure including the risks, benefits and alternatives for the proposed anesthesia with the patient or authorized representative who has indicated his/her understanding and acceptance.   Dental advisory given  Plan Discussed with: CRNA and Surgeon  Anesthesia Plan Comments: (Plan routine monitors, GA- LMA OK)        Anesthesia Quick Evaluation

## 2014-08-23 NOTE — ED Notes (Signed)
Pt reports infected cuticle in her R thumb x 4 days, it's getting worse. Pt is a diabetic.  Swelling, drainage and redness noted to R thumb.

## 2014-08-23 NOTE — H&P (Addendum)
Krista Arroyo is an 74 y.o. female.   Chief Complaint: right thumb infection HPI: 74 yo female states she got a paronychia after getting nails done while on a cruise last week.  I&D performed by ship doctor and started on levaquin.  Has had progressive worsening of thumb.  No fevers, chills, night sweats.  No previous problem with thumb.  Diabetic with poor blood sugar control.  Past Medical History  Diagnosis Date  . Diabetes mellitus without complication   . Hypertension     Past Surgical History  Procedure Laterality Date  . Abdominal hysterectomy    . Renal stent      History reviewed. No pertinent family history. Social History:  reports that she has been smoking Cigarettes.  She has been smoking about 0.50 packs per day. She does not have any smokeless tobacco history on file. She reports that she does not drink alcohol or use illicit drugs.  Allergies:  Allergies  Allergen Reactions  . Influenza Vaccines      (Not in a hospital admission)  Results for orders placed or performed during the hospital encounter of 08/23/14 (from the past 48 hour(s))  CBC with Differential/Platelet     Status: Abnormal   Collection Time: 08/23/14  8:28 PM  Result Value Ref Range   WBC 6.0 4.0 - 10.5 K/uL   RBC 5.07 3.87 - 5.11 MIL/uL   Hemoglobin 16.6 (H) 12.0 - 15.0 g/dL   HCT 46.7 (H) 36.0 - 46.0 %   MCV 92.1 78.0 - 100.0 fL   MCH 32.7 26.0 - 34.0 pg   MCHC 35.5 30.0 - 36.0 g/dL   RDW 12.6 11.5 - 15.5 %   Platelets 78 (L) 150 - 400 K/uL    Comment: REPEATED TO VERIFY SPECIMEN CHECKED FOR CLOTS PLATELET COUNT CONFIRMED BY SMEAR    Neutrophils Relative % 62 43 - 77 %   Lymphocytes Relative 26 12 - 46 %   Monocytes Relative 11 3 - 12 %   Eosinophils Relative 1 0 - 5 %   Basophils Relative 0 0 - 1 %   Neutro Abs 3.6 1.7 - 7.7 K/uL   Lymphs Abs 1.6 0.7 - 4.0 K/uL   Monocytes Absolute 0.7 0.1 - 1.0 K/uL   Eosinophils Absolute 0.1 0.0 - 0.7 K/uL   Basophils Absolute 0.0 0.0 -  0.1 K/uL   Smear Review MORPHOLOGY UNREMARKABLE   Basic metabolic panel     Status: Abnormal   Collection Time: 08/23/14  8:28 PM  Result Value Ref Range   Sodium 131 (L) 135 - 145 mmol/L   Potassium 4.9 3.5 - 5.1 mmol/L   Chloride 97 (L) 101 - 111 mmol/L   CO2 21 (L) 22 - 32 mmol/L   Glucose, Bld 372 (H) 65 - 99 mg/dL   BUN 27 (H) 6 - 20 mg/dL   Creatinine, Ser 1.23 (H) 0.44 - 1.00 mg/dL   Calcium 10.4 (H) 8.9 - 10.3 mg/dL   GFR calc non Af Amer 42 (L) >60 mL/min   GFR calc Af Amer 49 (L) >60 mL/min    Comment: (NOTE) The eGFR has been calculated using the CKD EPI equation. This calculation has not been validated in all clinical situations. eGFR's persistently <60 mL/min signify possible Chronic Kidney Disease.    Anion gap 13 5 - 15  Urinalysis, Routine w reflex microscopic     Status: Abnormal   Collection Time: 08/23/14  9:15 PM  Result Value Ref Range  Color, Urine YELLOW YELLOW   APPearance CLEAR CLEAR   Specific Gravity, Urine 1.021 1.005 - 1.030   pH 6.0 5.0 - 8.0   Glucose, UA >1000 (A) NEGATIVE mg/dL   Hgb urine dipstick NEGATIVE NEGATIVE   Bilirubin Urine NEGATIVE NEGATIVE   Ketones, ur NEGATIVE NEGATIVE mg/dL   Protein, ur NEGATIVE NEGATIVE mg/dL   Urobilinogen, UA 0.2 0.0 - 1.0 mg/dL   Nitrite NEGATIVE NEGATIVE   Leukocytes, UA NEGATIVE NEGATIVE  Urine microscopic-add on     Status: None   Collection Time: 08/23/14  9:15 PM  Result Value Ref Range   Squamous Epithelial / LPF RARE RARE    Dg Finger Thumb Right  08/23/2014   CLINICAL DATA:  Distal thumb pain and swelling.  History of I&D.  EXAM: RIGHT THUMB 2+V  COMPARISON:  None.  FINDINGS: There is mild diffuse soft tissue swelling about the palmar aspect of the distal phalanx of the thumb. This finding is associated with an apparent small soft tissue laceration. No subcutaneous emphysema. No radiopaque foreign body. No discrete area of osteolysis to suggest osteomyelitis.  No fracture or dislocation.  Mild-to-moderate degenerative change of the IP and MCP joints of the thumb.  IMPRESSION: Soft tissue swelling and apparent soft tissue laceration about the palmar aspect of the distal phalanx of the thumb without associated radiopaque foreign body or evidence of osteomyelitis.   Electronically Signed   By: Sandi Mariscal M.D.   On: 08/23/2014 21:11     A comprehensive review of systems was negative.  Blood pressure 135/65, pulse 77, temperature 98.5 F (36.9 C), temperature source Oral, resp. rate 16, height 5' 4" (1.626 m), weight 70.308 kg (155 lb), SpO2 95 %.  General appearance: alert, cooperative and appears stated age Head: Normocephalic, without obvious abnormality, atraumatic Neck: supple, symmetrical, trachea midline Resp: clear to auscultation bilaterally Cardio: regular rate and rhythm GI: non tender Extremities: intact sensation and capillary refill all digits.  +epl/fpl/io.  right thumb with wound on ulnar side.  fluctuance and swelling of pad.  no tenderness over proximal phalanx.  no active drainage.  patient notes infection started at nail edge on ulnar side.  no proximal streaking. Pulses: 2+ and symmetric Skin: Skin color, texture, turgor normal. No rashes or lesions Neurologic: Grossly normal Incision/Wound: As above  Assessment/Plan Right thumb felon and paronychia.  Recommend OR for incision and drainage.  Risks, benefits, and alternatives of surgery were discussed and the patient agrees with the plan of care.  Hospitalist to admit for observation and blood sugar control.  Shaquella Stamant R 08/23/2014, 11:52 PM

## 2014-08-23 NOTE — Progress Notes (Signed)
ANTIBIOTIC CONSULT NOTE - INITIAL  Pharmacy Consult for Vancomycin & Zosyn Indication: Wound infection  Allergies  Allergen Reactions  . Influenza Vaccines     Patient Measurements: Height: 5\' 4"  (162.6 cm) Weight: 155 lb (70.308 kg) IBW/kg (Calculated) : 54.7  Vital Signs: Temp: 98.5 F (36.9 C) (05/22 2118) Temp Source: Oral (05/22 2118) BP: 148/70 mmHg (05/22 2118) Pulse Rate: 87 (05/22 2118) Intake/Output from previous day:   Intake/Output from this shift:    Labs:  Recent Labs  08/23/14 2028  WBC 6.0  HGB 16.6*  PLT 78*  CREATININE 1.23*   Estimated Creatinine Clearance: 39.2 mL/min (by C-G formula based on Cr of 1.23). No results for input(s): VANCOTROUGH, VANCOPEAK, VANCORANDOM, GENTTROUGH, GENTPEAK, GENTRANDOM, TOBRATROUGH, TOBRAPEAK, TOBRARND, AMIKACINPEAK, AMIKACINTROU, AMIKACIN in the last 72 hours.   Microbiology: No results found for this or any previous visit (from the past 720 hour(s)).  Medical History: Past Medical History  Diagnosis Date  . Diabetes mellitus without complication   . Hypertension     Medications:  Scheduled:   Infusions:  . piperacillin-tazobactam    . [START ON 08/24/2014] piperacillin-tazobactam (ZOSYN)  IV    . vancomycin    . [START ON 08/24/2014] vancomycin     Assessment:  [age] yr female with diabetes presents with infected cuticle on thumb. Drainage, swelling and redness noted.  Exam shows no osteomyelitis.  Pharmacy consulted to dose Vancomycin & Zosyn for wound infection  CrCl ~ 39 ml/min  [facility]  Goal of Therapy:  Vancomycin trough level 15-20 mcg/ml  Plan:  Measure antibiotic drug levels at steady state  Zosyn 3.375gm IV q8h (each dose infused over 4 hrs) Vancomycin 1gm IV q24h  Omar Orrego, Joselyn GlassmanLeann Trefz, PharmD 08/23/2014,9:45 PM

## 2014-08-23 NOTE — ED Notes (Signed)
Patients right thumb is purple in color with yellowish white skin. Daughter has paper work for when the patient was first treated for her thumb.

## 2014-08-23 NOTE — ED Provider Notes (Addendum)
CSN: [id number]     Arrival date & time 08/23/14  1925 History   First MD Initiated Contact with Patient 08/23/14 2007     Chief Complaint  Patient presents with  . Wound Infection     (Consider location/radiation/quality/duration/timing/severity/associated sxs/prior Treatment) HPI Comments: Patient history of diabetes and hypertension presents to the emergency department with chief complaint of right thumb pain. She states that she recently returned from a cruise, where she had a manicure which led to a paronychia. The paronychia was drained by the cruise ship physician.  She reports that she has been taking antibiotics as well with no relief. She states that the infection has been spreading. Her pain is worsening. The pain is moderate to severe. It is worsened with movement and palpation. She denies any fevers chills. She states that additionally her blood sugar has been running high lately.  She has taken Levaquin for the past 3 days.  The history is provided by the patient. No language interpreter was used.    Past Medical History  Diagnosis Date  . Diabetes mellitus without complication   . Hypertension    Past Surgical History  Procedure Laterality Date  . Abdominal hysterectomy     No family history on file. History  Substance Use Topics  . Smoking status: Current Every Day Smoker -- 0.50 packs/day    Types: Cigarettes  . Smokeless tobacco: Not on file  . Alcohol Use: No   OB History    No data available     Review of Systems  Constitutional: Negative for fever and chills.  Respiratory: Negative for shortness of breath.   Cardiovascular: Negative for chest pain.  Gastrointestinal: Negative for nausea, vomiting, diarrhea and constipation.  Genitourinary: Negative for dysuria.  Skin: Positive for wound.  All other systems reviewed and are negative.     Allergies  Influenza vaccines  Home Medications   Prior to Admission medications   Medication Sig Start  Date End Date Taking? Authorizing Provider  ALPRAZolam Prudy Feeler) 0.5 MG tablet Take 0.5 mg by mouth daily as needed. anxiety 05/21/14  Yes Historical Provider, MD  amLODipine (NORVASC) 10 MG tablet Take 10 mg by mouth daily. 06/11/14  Yes Historical Provider, MD  BYSTOLIC 10 MG tablet Take 10 mg by mouth daily. 06/06/14  Yes Historical Provider, MD  DULoxetine (CYMBALTA) 30 MG capsule Take 30 mg by mouth daily. 05/23/14  Yes Historical Provider, MD  LEVEMIR FLEXTOUCH 100 UNIT/ML Pen Inject 40 Units into the skin 2 (two) times daily. 08/13/14  Yes Historical Provider, MD  levofloxacin (LEVAQUIN) 500 MG tablet Take 500 mg by mouth daily.   Yes Historical Provider, MD  levothyroxine (SYNTHROID, LEVOTHROID) 50 MCG tablet Take 50 mcg by mouth daily. 06/06/14  Yes Historical Provider, MD  REPATHA SURECLICK 140 MG/ML SOAJ Inject 140 mg into the skin every 28 (twenty-eight) days. 08/08/14  Yes Historical Provider, MD  spironolactone (ALDACTONE) 50 MG tablet Take 50 mg by mouth daily. 05/23/14  Yes Historical Provider, MD   BP 148/64 mmHg  Pulse 91  Temp(Src) 98.5 F (36.9 C) (Oral)  Resp 18  Ht  (1.626 m)  Wt 155 lb (70.308 kg)  BMI 26.59 kg/m2  SpO2 99% Physical Exam  Constitutional: She is oriented to person, place, and time. She appears well-developed and well-nourished.  HENT:  Head: Normocephalic and atraumatic.  Eyes: Conjunctivae and EOM are normal.  Neck: Normal range of motion.  Cardiovascular: Normal rate.   Pulmonary/Chest: Effort normal.  Abdominal:  She exhibits no distension.  Musculoskeletal: Normal range of motion.  Neurological: She is alert and oriented to person, place, and time.  Skin: Skin is dry.  Right thumb as pictured below  Psychiatric: She has a normal mood and affect. Her behavior is normal. Judgment and thought content normal.  Nursing note and vitals reviewed.   ED Course  Procedures (including critical care time) Labs Review Labs Reviewed  CBC WITH  DIFFERENTIAL/PLATELET  BASIC METABOLIC PANEL    Imaging Review No results found.   EKG Interpretation None          MDM   Final diagnoses:  None    Patient with right thumb felon failing outpatient therapy. Currently taking Levaquin. Had a paronychia drained while on a cruise ship.  Discussed with Dr. Madilyn Hookees, who recommends consulting hand surgery.  Patient discussed with Dr. Merlyn LotKuzma from hand surgery, who recommends that the patient go to come for incision and drainage. He is expecting the patient. Plan to see the patient in fast track. I discussed the patient with [facility] and Dr. Manus Gunningancour who is aware of the situation.  Patient seen by and discussed with Dr. Madilyn Hookees, who agrees with plan.  Patient will transport herself by private vehicle.      Roxy Horsemanobert Tamiyah Moulin, PA-C 08/23/14 2139  Roxy Horsemanobert Yanique Mulvihill, PA-C 08/23/14 [id number]  Tilden FossaElizabeth Rees, MD 08/23/14 2250  Roxy Horsemanobert [place], PA-C 08/26/14 1503  Tilden FossaElizabeth Rees, MD 08/26/14 [phone]

## 2014-08-24 ENCOUNTER — Observation Stay (HOSPITAL_COMMUNITY): Payer: Medicare Other | Admitting: Anesthesiology

## 2014-08-24 DIAGNOSIS — N179 Acute kidney failure, unspecified: Secondary | ICD-10-CM | POA: Diagnosis present

## 2014-08-24 DIAGNOSIS — D696 Thrombocytopenia, unspecified: Secondary | ICD-10-CM | POA: Diagnosis present

## 2014-08-24 DIAGNOSIS — F17210 Nicotine dependence, cigarettes, uncomplicated: Secondary | ICD-10-CM | POA: Diagnosis present

## 2014-08-24 DIAGNOSIS — L03011 Cellulitis of right finger: Secondary | ICD-10-CM | POA: Diagnosis present

## 2014-08-24 DIAGNOSIS — I10 Essential (primary) hypertension: Secondary | ICD-10-CM | POA: Diagnosis present

## 2014-08-24 DIAGNOSIS — Z79899 Other long term (current) drug therapy: Secondary | ICD-10-CM | POA: Diagnosis not present

## 2014-08-24 DIAGNOSIS — E860 Dehydration: Secondary | ICD-10-CM | POA: Diagnosis present

## 2014-08-24 DIAGNOSIS — Z794 Long term (current) use of insulin: Secondary | ICD-10-CM | POA: Diagnosis not present

## 2014-08-24 DIAGNOSIS — E039 Hypothyroidism, unspecified: Secondary | ICD-10-CM | POA: Diagnosis not present

## 2014-08-24 DIAGNOSIS — E871 Hypo-osmolality and hyponatremia: Secondary | ICD-10-CM | POA: Diagnosis present

## 2014-08-24 DIAGNOSIS — E1165 Type 2 diabetes mellitus with hyperglycemia: Secondary | ICD-10-CM | POA: Diagnosis present

## 2014-08-24 DIAGNOSIS — M7989 Other specified soft tissue disorders: Secondary | ICD-10-CM | POA: Diagnosis present

## 2014-08-24 LAB — CBC WITH DIFFERENTIAL/PLATELET
Basophils Absolute: 0 10*3/uL (ref 0.0–0.1)
Basophils Relative: 0 % (ref 0–1)
EOS PCT: 1 % (ref 0–5)
Eosinophils Absolute: 0.1 10*3/uL (ref 0.0–0.7)
HCT: 41.7 % (ref 36.0–46.0)
Hemoglobin: 14.5 g/dL (ref 12.0–15.0)
LYMPHS PCT: 26 % (ref 12–46)
Lymphs Abs: 1.1 10*3/uL (ref 0.7–4.0)
MCH: 31.9 pg (ref 26.0–34.0)
MCHC: 34.8 g/dL (ref 30.0–36.0)
MCV: 91.6 fL (ref 78.0–100.0)
Monocytes Absolute: 0.5 10*3/uL (ref 0.1–1.0)
Monocytes Relative: 12 % (ref 3–12)
Neutro Abs: 2.7 10*3/uL (ref 1.7–7.7)
Neutrophils Relative %: 61 % (ref 43–77)
Platelets: 61 10*3/uL — ABNORMAL LOW (ref 150–400)
RBC: 4.55 MIL/uL (ref 3.87–5.11)
RDW: 12.6 % (ref 11.5–15.5)
WBC: 4.3 10*3/uL (ref 4.0–10.5)

## 2014-08-24 LAB — COMPREHENSIVE METABOLIC PANEL
ALBUMIN: 3 g/dL — AB (ref 3.5–5.0)
ALT: 20 U/L (ref 14–54)
AST: 19 U/L (ref 15–41)
Alkaline Phosphatase: 40 U/L (ref 38–126)
Anion gap: 8 (ref 5–15)
BILIRUBIN TOTAL: 0.3 mg/dL (ref 0.3–1.2)
BUN: 16 mg/dL (ref 6–20)
CO2: 23 mmol/L (ref 22–32)
Calcium: 9.6 mg/dL (ref 8.9–10.3)
Chloride: 104 mmol/L (ref 101–111)
Creatinine, Ser: 0.87 mg/dL (ref 0.44–1.00)
GFR calc non Af Amer: >60 mL/min (ref >60)
Glucose, Bld: 263 mg/dL — ABNORMAL HIGH (ref 65–99)
Potassium: 3.8 mmol/L (ref 3.5–5.1)
SODIUM: 135 mmol/L (ref 135–145)
TOTAL PROTEIN: 6.3 g/dL — AB (ref 6.5–8.1)

## 2014-08-24 LAB — GLUCOSE, CAPILLARY
GLUCOSE-CAPILLARY: 224 mg/dL — AB (ref 65–99)
GLUCOSE-CAPILLARY: 240 mg/dL — AB (ref 65–99)
GLUCOSE-CAPILLARY: 292 mg/dL — AB (ref 65–99)
Glucose-Capillary: 248 mg/dL — ABNORMAL HIGH (ref 65–99)
Glucose-Capillary: 322 mg/dL — ABNORMAL HIGH (ref 65–99)

## 2014-08-24 LAB — GRAM STAIN

## 2014-08-24 MED ORDER — ENOXAPARIN SODIUM 40 MG/0.4ML ~~LOC~~ SOLN: 40.0000 mg | SUBCUTANEOUS | Status: DC

## 2014-08-24 MED ORDER — ALPRAZOLAM 0.5 MG PO TABS: 0.5000 mg | ORAL_TABLET | Freq: Every day | ORAL | Status: DC | PRN

## 2014-08-24 MED ORDER — MEPERIDINE HCL 25 MG/ML IJ SOLN: 6.2500 mg | INTRAMUSCULAR | Status: DC | PRN

## 2014-08-24 MED ORDER — PROPOFOL 10 MG/ML IV BOLUS
INTRAVENOUS | Status: DC | PRN
  Administered 2014-08-24: 50 mg via INTRAVENOUS
  Administered 2014-08-24: 100 mg via INTRAVENOUS

## 2014-08-24 MED ORDER — DULOXETINE HCL 30 MG PO CPEP
30.0000 mg | ORAL_CAPSULE | Freq: Every day | ORAL | Status: DC
  Administered 2014-08-24: 30 mg via ORAL
  Filled 2014-08-24: qty 1

## 2014-08-24 MED ORDER — ONDANSETRON HCL 4 MG/2ML IJ SOLN: 4.0000 mg | Freq: Four times a day (QID) | INTRAMUSCULAR | Status: DC | PRN

## 2014-08-24 MED ORDER — AMLODIPINE BESYLATE 10 MG PO TABS
10.0000 mg | ORAL_TABLET | Freq: Every day | ORAL | Status: DC
  Administered 2014-08-24: 10 mg via ORAL
  Filled 2014-08-24: qty 1

## 2014-08-24 MED ORDER — SODIUM CHLORIDE 0.9 % IV SOLN
INTRAVENOUS | Status: DC
  Administered 2014-08-24: 03:00:00 via INTRAVENOUS

## 2014-08-24 MED ORDER — LEVOTHYROXINE SODIUM 50 MCG PO TABS
50.0000 ug | ORAL_TABLET | Freq: Every day | ORAL | Status: DC
  Administered 2014-08-24: 50 ug via ORAL
  Filled 2014-08-24: qty 1

## 2014-08-24 MED ORDER — MIDAZOLAM HCL 5 MG/5ML IJ SOLN
INTRAMUSCULAR | Status: DC | PRN
  Administered 2014-08-24: 1 mg via INTRAVENOUS

## 2014-08-24 MED ORDER — FENTANYL CITRATE (PF) 100 MCG/2ML IJ SOLN
INTRAMUSCULAR | Status: DC | PRN
  Administered 2014-08-24: 50 ug via INTRAVENOUS

## 2014-08-24 MED ORDER — DOXYCYCLINE HYCLATE 100 MG PO CAPS: 100.0000 mg | ORAL_CAPSULE | Freq: Two times a day (BID) | ORAL | Status: DC

## 2014-08-24 MED ORDER — BUPIVACAINE HCL (PF) 0.25 % IJ SOLN
INTRAMUSCULAR | Status: AC
  Filled 2014-08-24: qty 30

## 2014-08-24 MED ORDER — SPIRONOLACTONE 50 MG PO TABS
50.0000 mg | ORAL_TABLET | Freq: Every day | ORAL | Status: DC
  Filled 2014-08-24: qty 1

## 2014-08-24 MED ORDER — ACETAMINOPHEN 325 MG PO TABS
650.0000 mg | ORAL_TABLET | Freq: Four times a day (QID) | ORAL | Status: DC | PRN
  Administered 2014-08-24: 650 mg via ORAL
  Filled 2014-08-24: qty 2

## 2014-08-24 MED ORDER — ACETAMINOPHEN 650 MG RE SUPP: 650.0000 mg | Freq: Four times a day (QID) | RECTAL | Status: DC | PRN

## 2014-08-24 MED ORDER — ONDANSETRON HCL 4 MG/2ML IJ SOLN
INTRAMUSCULAR | Status: DC | PRN
  Administered 2014-08-24: 4 mg via INTRAVENOUS

## 2014-08-24 MED ORDER — ONDANSETRON HCL 4 MG PO TABS: 4.0000 mg | ORAL_TABLET | Freq: Four times a day (QID) | ORAL | Status: DC | PRN

## 2014-08-24 MED ORDER — PROMETHAZINE HCL 25 MG/ML IJ SOLN: 6.2500 mg | INTRAMUSCULAR | Status: DC | PRN

## 2014-08-24 MED ORDER — BUPIVACAINE HCL (PF) 0.25 % IJ SOLN
INTRAMUSCULAR | Status: DC | PRN
  Administered 2014-08-24: 6.5 mL

## 2014-08-24 MED ORDER — NEBIVOLOL HCL 10 MG PO TABS
10.0000 mg | ORAL_TABLET | Freq: Every day | ORAL | Status: DC
  Administered 2014-08-24: 10 mg via ORAL
  Filled 2014-08-24: qty 1

## 2014-08-24 MED ORDER — HYDRALAZINE HCL 20 MG/ML IJ SOLN: 5.0000 mg | INTRAMUSCULAR | Status: DC | PRN

## 2014-08-24 MED ORDER — VANCOMYCIN HCL IN DEXTROSE 1-5 GM/200ML-% IV SOLN
INTRAVENOUS | Status: AC
  Administered 2014-08-24: 1000 mg via INTRAVENOUS
  Filled 2014-08-24: qty 200

## 2014-08-24 MED ORDER — HYDROCODONE-ACETAMINOPHEN 5-325 MG PO TABS: 1.0000 | ORAL_TABLET | Freq: Four times a day (QID) | ORAL | Status: DC | PRN

## 2014-08-24 MED ORDER — INSULIN DETEMIR 100 UNIT/ML ~~LOC~~ SOLN
40.0000 [IU] | Freq: Two times a day (BID) | SUBCUTANEOUS | Status: DC
  Administered 2014-08-24: 40 [IU] via SUBCUTANEOUS
  Filled 2014-08-24 (×3): qty 0.4

## 2014-08-24 MED ORDER — FENTANYL CITRATE (PF) 100 MCG/2ML IJ SOLN: 25.0000 ug | INTRAMUSCULAR | Status: DC | PRN

## 2014-08-24 MED ORDER — VANCOMYCIN HCL 10 G IV SOLR
1250.0000 mg | INTRAVENOUS | Status: DC
  Filled 2014-08-24: qty 1250

## 2014-08-24 MED ORDER — SODIUM CHLORIDE 0.9 % IV SOLN: INTRAVENOUS | Status: DC

## 2014-08-24 MED ORDER — INSULIN ASPART 100 UNIT/ML ~~LOC~~ SOLN
0.0000 [IU] | Freq: Three times a day (TID) | SUBCUTANEOUS | Status: DC
  Administered 2014-08-24: 5 [IU] via SUBCUTANEOUS
  Administered 2014-08-24: 11 [IU] via SUBCUTANEOUS

## 2014-08-24 MED ORDER — MIDAZOLAM HCL 2 MG/2ML IJ SOLN: 0.5000 mg | Freq: Once | INTRAMUSCULAR | Status: DC | PRN

## 2014-08-24 MED ORDER — LIDOCAINE HCL (CARDIAC) 20 MG/ML IV SOLN
INTRAVENOUS | Status: DC | PRN
  Administered 2014-08-24: 25 mg via INTRAVENOUS

## 2014-08-24 MED ORDER — SODIUM CHLORIDE 0.9 % IJ SOLN
3.0000 mL | Freq: Two times a day (BID) | INTRAMUSCULAR | Status: DC
Start: 2014-08-24 — End: 2014-08-24
  Administered 2014-08-24: 3 mL via INTRAVENOUS

## 2014-08-24 MED ORDER — VANCOMYCIN HCL IN DEXTROSE 1-5 GM/200ML-% IV SOLN
1000.0000 mg | INTRAVENOUS | Status: DC
  Filled 2014-08-24: qty 200

## 2014-08-24 NOTE — Op Note (Signed)
NAME:  Krista Arroyo, Krista Arroyo            ACCOUNT NO.:  [id number]  MEDICAL RECORD NO.:  [id number]  LOCATION:  [facility]                         FACILITY:  [facility]  PHYSICIAN:  Betha LoaKevin Torry Istre, MD        DATE OF BIRTH:  Jan 08, 1941  DATE OF PROCEDURE:  08/23/2014 DATE OF DISCHARGE:  08/23/2014                              OPERATIVE REPORT   PREOPERATIVE DIAGNOSIS:  Right thumb felon and paronychia.  POSTOPERATIVE DIAGNOSIS:  Right thumb felon and paronychia.  PROCEDURE:  Incision and drainage of right thumb and removal of the nail plates.  SURGEON:  Betha LoaKevin Stokes Rattigan, MD.  ASSISTANT:  None.  ANESTHESIA:  General.  IV FLUIDS:  Per anesthesia flow sheet.  ESTIMATED BLOOD LOSS:  Minimal.  COMPLICATIONS:  None.  SPECIMENS:  Cultures to micro.  TOURNIQUET TIME:  15 minutes at 250 mmHg.  DISPOSITION:  Stable to PACU.  INDICATIONS:  Ms. Krista Arroyo is a 74 year old female who states that while on a cruise approximately a week ago she suffered a paronychia of the right thumb.  This was drained by the ship doctor.  She has had worsening symptoms in the thumb since.  She presented to the emergency department the evening of Aug 23, 2014.  I was consulted for management of the condition.  On examination, she had a swollen, erythematous, and painful right thumb.  There was no pain over the proximal phalanx.  She can flex and extend the IP joint.  There was evidence of an incision on the ulnar side of the thumb.  I recommended operative incision and drainage by removal of nail plate.  Risks, benefits, and alternatives of surgery were discussed including risk of blood loss, infection, damage to nerves, vessels, tendons, ligaments, bone, failure of surgery, need for additional surgery, complications with wound healing, continued pain, continued infection, need for repeat irrigation and debridement. She voiced understanding of these risks and elected to proceed.  OPERATIVE COURSE:  After being identified  preoperatively by myself, the patient and I agreed upon procedure and site of procedure.  Surgical site was marked.  The risks, benefits, and alternatives of surgery were reviewed and she wished to proceed.  Surgical consent had been signed. She was transferred to the operating room and placed on the operating room table in supine position with the right upper extremity on arm board.  General anesthesia was induced by Anesthesiology.  Right upper extremity was prepped and draped in normal sterile orthopedic fashion. Surgical pause was performed between surgeons, anesthesia, and operating room staff, and all were in agreement as to the patient, procedure, and site of procedure.  Tourniquet at the proximal aspect of the extremity was inflated to 250 mmHg after gravity exsanguination of the thumb and Esmarch exsanguination of the hand and forearm.  The wound was spread. There was gross purulence.  Cultures were taken for aerobes, anaerobes. Care was taken to ensure that all septae of the padded thumb were divided.  The abscess pocket appeared to course proximally.  With milking at the base of the distal phalanx more purulence had come out. Milking over the proximal phalanx did not produce any pus.  The nail plate was removed.  The skin at the  ulnar side had been devitalized. This appeared to be the source of the infection.  The wound was copiously irrigated with a 1000 mL of sterile saline by bulb syringe. It was then packed open with quarter-inch iodoform gauze.  A piece of Xeroform was placed in the nail fold and the nail and pad of the finger dressed with sterile Xeroform.  At the beginning of the case, the epidermis and the pad of the finger was debrided.  There was some purulence and sebaceous appearing material underneath.  The dermis was intact.  The wounds were dressed with sterile 4x4s and wrapped with a Coban dressing lightly.  Alumafoam splint was placed and wrapped lightly with a  Coban dressing.  A digital block had been performed with 7 mL of 0.25% plain Marcaine to aid in postoperative analgesia.  The tourniquet was deflated at 15 minutes.  Fingertips were pink with brisk capillary refill after deflation of the tourniquet.  The operative drapes were broken down, and the patient was awoken from anesthesia safely.  She was transferred back to stretcher and taken to PACU in stable condition. She is going to be kept overnight by the hospitalist for assistance in management of her blood sugars.  I will see her back in the office in 2- 3 days for postoperative followup.  She was given a dose of IV vancomycin after cultures had been taken.  She was previously on Levaquin.     Betha Loa, MD     KK/MEDQ  D:  08/24/2014  T:  08/24/2014  Job:  [id number]

## 2014-08-24 NOTE — Op Note (Signed)
[id number] 

## 2014-08-24 NOTE — Anesthesia Procedure Notes (Signed)
Procedure Name: LMA Insertion Date/Time: 08/24/2014 12:27 AM Performed by: Keanna Tugwell S Pre-anesthesia Checklist: Patient identified, Timeout performed, Emergency Drugs available, Suction available and Patient being monitored Patient Re-evaluated:Patient Re-evaluated prior to inductionOxygen Delivery Method: Circle system utilized Preoxygenation: Pre-oxygenation with 100% oxygen Intubation Type: IV induction Ventilation: Mask ventilation without difficulty LMA: LMA inserted LMA Size: 4.0 Tube type: Oral Number of attempts: 1 Airway Equipment and Method: Stylet Placement Confirmation: positive ETCO2 and breath sounds checked- equal and bilateral Tube secured with: Tape Dental Injury: Teeth and Oropharynx as per pre-operative assessment

## 2014-08-24 NOTE — Progress Notes (Signed)
  RD consulted for nutrition education regarding diabetes. Per chart, her recent hemoglobin A1c done 2 weeks ago was 13.   Pt sitting in chair dressed and ready for discharge at time of visit. Pt states she is eating well and reports controlling her blood sugar PTA without problems. RD emphasized the importance of controlling blood sugar and eating healthfully for wound healing. Pt declines any nutrition education at this time; states she has had diabetes for a long time and knows what to do.  Body mass index is 26.51 kg/(m^2). Pt meets criteria for Overweight based on current BMI.  Current diet order is Heart Healthy/Carb Modified, patient is consuming approximately 75% of meals at this time. Labs and medications reviewed. No nutrition interventions warranted at this time.  Ian Malkineanne Barnett RD, LDN Inpatient Clinical Dietitian Pager: [phone] After Hours Pager: [phone]

## 2014-08-24 NOTE — Anesthesia Postprocedure Evaluation (Signed)
  Anesthesia Post-op Note  Patient: Krista Arroyo  Procedure(s) Performed: Procedure(s): IRRIGATION AND DEBRIDEMENT RIGHT THUMB (Right)  Patient Location: PACU  Anesthesia Type:General  Level of Consciousness: awake, alert , oriented and patient cooperative  Airway and Oxygen Therapy: Patient Spontanous Breathing and Patient connected to nasal cannula oxygen  Post-op Pain: none  Post-op Assessment: Post-op Vital signs reviewed, Patient's Cardiovascular Status Stable, Respiratory Function Stable, Patent Airway, No signs of Nausea or vomiting and Pain level controlled  Post-op Vital Signs: Reviewed and stable  Last Vitals:  Filed Vitals:   08/24/14 0135  BP: 138/68  Pulse: 76  Temp: 36.5 C  Resp: 16    Complications: No apparent anesthesia complications

## 2014-08-24 NOTE — Transfer of Care (Signed)
Immediate Anesthesia Transfer of Care Note  Patient: Krista Arroyo  Procedure(s) Performed: Procedure(s): IRRIGATION AND DEBRIDEMENT RIGHT THUMB (Right)  Patient Location: PACU  Anesthesia Type:General  Level of Consciousness: awake, alert  and oriented  Airway & Oxygen Therapy: Patient Spontanous Breathing and Patient connected to nasal cannula oxygen  Post-op Assessment: Report given to RN and Post -op Vital signs reviewed and stable  Post vital signs: Reviewed and stable  Last Vitals:  Filed Vitals:   08/24/14 0106  BP:   Pulse:   Temp: 36.5 C  Resp:     Complications: No apparent anesthesia complications

## 2014-08-24 NOTE — H&P (Signed)
[facility] History and Physical  Krista CriglerCarol A Korver ZOX:[id number] DOB: 05/24/1940 DOA: 08/23/2014  Referring physician: Dr.Kuzma. PCP: Ralene OkMOREIRA,ROY, MD  Specialists: None.  Chief Complaint: Right thumb swelling and discoloration.  HPI: Krista Arroyo is a 74 y.o. female with history of diabetes mellitus type 2, hypertension, hypothyroidism, chronic thrombocytopenia presents to the ER because of worsening swelling of the right thumb. Patient was recently in a cruise when patient developed infection of the right thumb and as per the patient patient was treated by the cruise doctor and was placed on Levaquin. Despite which patient swelling worsened and came to the ER. At this time hand surgeon Dr. Merlyn LotKuzma has seen the patient has requested medical admission for uncontrolled diabetes management and patient will be taken to the OR for right thumb abscess incision and drainage. Patient states she has been having running elevated blood sugar recently and patient's primary care physician has recently increased her Levemir dose. And her recent hemoglobin A1c was 13. Patient otherwise denies any nausea vomiting abdominal pain diarrhea chest pain or shortness of breath. Denies any fever or chills.   Review of Systems: As presented in the history of presenting illness, rest negative.  Past Medical History  Diagnosis Date  . Diabetes mellitus without complication   . Hypertension    Past Surgical History  Procedure Laterality Date  . Abdominal hysterectomy    . Renal stent     Social History:  reports that she has been smoking Cigarettes.  She has been smoking about 0.50 packs per day. She does not have any smokeless tobacco history on file. She reports that she does not drink alcohol or use illicit drugs. Where does patient live home. Can patient participate in ADLs? Yes.  Allergies  Allergen Reactions  . Influenza Vaccines     Family History:  Family History  Problem Relation Age of  Onset  . Diabetes Mellitus II Mother       Prior to Admission medications   Medication Sig Start Date End Date Taking? Authorizing Provider  ALPRAZolam Prudy Feeler(XANAX) 0.5 MG tablet Take 0.5 mg by mouth daily as needed. anxiety 05/21/14  Yes Historical Provider, MD  amLODipine (NORVASC) 10 MG tablet Take 10 mg by mouth daily. 06/11/14  Yes Historical Provider, MD  BYSTOLIC 10 MG tablet Take 10 mg by mouth daily. 06/06/14  Yes Historical Provider, MD  DULoxetine (CYMBALTA) 30 MG capsule Take 30 mg by mouth daily. 05/23/14  Yes Historical Provider, MD  LEVEMIR FLEXTOUCH 100 UNIT/ML Pen Inject 40 Units into the skin 2 (two) times daily. 08/13/14  Yes Historical Provider, MD  levofloxacin (LEVAQUIN) 500 MG tablet Take 500 mg by mouth daily.   Yes Historical Provider, MD  levothyroxine (SYNTHROID, LEVOTHROID) 50 MCG tablet Take 50 mcg by mouth daily. 06/06/14  Yes Historical Provider, MD  REPATHA SURECLICK 140 MG/ML SOAJ Inject 140 mg into the skin every 28 (twenty-eight) days. 08/08/14  Yes Historical Provider, MD  spironolactone (ALDACTONE) 50 MG tablet Take 50 mg by mouth daily. 05/23/14  Yes Historical Provider, MD    Physical Exam: Filed Vitals:   08/23/14 1933 08/23/14 2002 08/23/14 2118 08/23/14 2300  BP: 148/64  148/70 135/65  Pulse: 91  87 77  Temp: 98.5 F (36.9 C)  98.5 F (36.9 C)   TempSrc: Oral  Oral   Resp: 18  16 16   Height:  5\' 4"  (1.626 m)    Weight:  70.308 kg (155 lb)    SpO2: 99%  96% 95%  General:  Moderately built and nourished.  Eyes: Anicteric no pallor.  ENT: No discharge from ears eyes nose and mouth.  Neck: No mass felt.  Cardiovascular: S1 and S2 heard.  Respiratory: No rhonchi or crepitations.  Abdomen: Soft nontender bowel sounds present.  Skin: Right thumb has discoloration extending the whole of distal phalanx with swelling.  Musculoskeletal: Right thumb distal phalanx swollen.  Psychiatric: Appears normal.  Neurologic: Alert awake oriented to time  place and person. Moves all extremities.  Labs on Admission:  Basic Metabolic Panel:  Recent Labs Lab 08/23/14 2028  NA 131*  K 4.9  CL 97*  CO2 21*  GLUCOSE 372*  BUN 27*  CREATININE 1.23*  CALCIUM 10.4*   Liver Function Tests: No results for input(s): AST, ALT, ALKPHOS, BILITOT, PROT, ALBUMIN in the last 168 hours. No results for input(s): LIPASE, AMYLASE in the last 168 hours. No results for input(s): AMMONIA in the last 168 hours. CBC:  Recent Labs Lab 08/23/14 2028  WBC 6.0  NEUTROABS 3.6  HGB 16.6*  HCT 46.7*  MCV 92.1  PLT 78*   Cardiac Enzymes: No results for input(s): CKTOTAL, CKMB, CKMBINDEX, TROPONINI in the last 168 hours.  BNP (last 3 results) No results for input(s): BNP in the last 8760 hours.  ProBNP (last 3 results) No results for input(s): PROBNP in the last 8760 hours.  CBG: No results for input(s): GLUCAP in the last 168 hours.  Radiological Exams on Admission: Dg Finger Thumb Right  08/23/2014   CLINICAL DATA:  Distal thumb pain and swelling.  History of I&D.  EXAM: RIGHT THUMB 2+V  COMPARISON:  None.  FINDINGS: There is mild diffuse soft tissue swelling about the palmar aspect of the distal phalanx of the thumb. This finding is associated with an apparent small soft tissue laceration. No subcutaneous emphysema. No radiopaque foreign body. No discrete area of osteolysis to suggest osteomyelitis.  No fracture or dislocation. Mild-to-moderate degenerative change of the IP and MCP joints of the thumb.  IMPRESSION: Soft tissue swelling and apparent soft tissue laceration about the palmar aspect of the distal phalanx of the thumb without associated radiopaque foreign body or evidence of osteomyelitis.   Electronically Signed   By: Simonne Come M.D.   On: 08/23/2014 21:11     Assessment/Plan Principal Problem:   Paronychia Active Problems:   Diabetes mellitus type 2, uncontrolled   Thrombocytopenia   Hypercalcemia   Hypothyroidism   1. Right  thumb abscess - appreciate Dr. Merrilee Seashore consult. Patient will be taken to the OR for incision and drainage. Patient has been placed on vancomycin. Follow wound cultures. 2. Uncontrolled diabetes mellitus type 2 - patient states her recent hemoglobin A1c done 2 weeks ago was 13 and her primary care physician had increased her Levemir dose. At this time I have continued with her home dose of Levemir and placed her on moderate dose sliding scale coverage. Closely follow CBGs. We will get a nutrition consult. 3. Acute renal failure - I'm going to hold off patient's spironolactone for now and gently hydrate. Post a follow-up metabolic panel. UA is unremarkable. 4. Hypertension mildly uncontrolled - closely follow blood pressure trends patient is on when necessary IV hydralazine. 5. Hyponatremia probably from dehydration and hyperglycemia - gently hydrate and hold spironolactone for now and closely follow metabolic panel. 6. Chronic thrombocytopenia - further workup as primary care. Follow CBC closely for any further worsening. 7. Hypercalcemia - patient states that she was told about her hypercalcemia by  patient's primary care physician. She had stopped taking her Tums. At this time closely follow metabolic panel. 8. Hypothyroidism on Synthroid.   DVT Prophylaxis Lovenox. Code Status: Full code.  Family Communication: Patient's daughter at the bedside.  Disposition Plan: Admit to inpatient. Likely stay 2 days.    Itzae Miralles N. [facility] Pager [phone].  If 7PM-7AM, please contact night-coverage [email or web address] Password [facility] 08/24/2014, 12:04 AM

## 2014-08-24 NOTE — Brief Op Note (Signed)
08/23/2014 - 08/24/2014  12:55 AM  PATIENT:  Krista Arroyo  74 y.o. female  PRE-OPERATIVE DIAGNOSIS:  infected right thumb  POST-OPERATIVE DIAGNOSIS:  Right thumb abscess  PROCEDURE:  Procedure(s): IRRIGATION AND DEBRIDEMENT RIGHT THUMB (Right)  SURGEON:  Surgeon(s) and Role:    * Betha LoaKevin Elijha Dedman, MD - Primary  PHYSICIAN ASSISTANT:   ASSISTANTS: none   ANESTHESIA:   general  EBL:  Total I/O In: 250 [I.V.:250] Out: -   BLOOD ADMINISTERED:none  DRAINS: iodoform packing  LOCAL MEDICATIONS USED:  MARCAINE     SPECIMEN:  Source of Specimen:  right thumb  DISPOSITION OF SPECIMEN:  micro  COUNTS:  YES  TOURNIQUET:  Right arm: 15 minutes at 250 mmHg  DICTATION: .Other Dictation: Dictation Number [id number]  PLAN OF CARE: Admit for overnight observation  PATIENT DISPOSITION:  PACU - hemodynamically stable.   Delay start of Pharmacological VTE agent (>24hrs) due to surgical blood loss or risk of bleeding: no

## 2014-08-24 NOTE — Discharge Summary (Signed)
Physician Discharge Summary  Krista CriglerCarol A Guderian ZOX:[id number] DOB: 08/07/1940 DOA: 08/23/2014  PCP: Ralene OkMOREIRA,ROY, MD  Admit date: 08/23/2014 Discharge date: 08/24/2014  Time spent: > 30 minutes  Recommendations for Outpatient Follow-up:  1. Follow up with Dr. Ludwig ClarksMoreira in 1 week for DM management 2. Follow up with Dr. Merlyn LotKuzma in 1-2 days. Patient will call his office.    Discharge Diagnoses:  Principal Problem:   Paronychia Active Problems:   Diabetes mellitus type 2, uncontrolled   Thrombocytopenia   Hypercalcemia   Hypothyroidism  Discharge Condition: stable  Diet recommendation: diabetic  Filed Weights   08/23/14 2002 08/24/14 0158 08/24/14 0546  Weight: 70.308 kg (155 lb) 70.3 kg (154 lb 15.7 oz) 70.1 kg (154 lb 8.7 oz)   History of present illness:  Krista Arroyo is a 74 y.o. female with history of diabetes mellitus type 2, hypertension, hypothyroidism, chronic thrombocytopenia presents to the ER because of worsening swelling of the right thumb. Patient was recently in a cruise when patient developed infection of the right thumb and as per the patient patient was treated by the cruise doctor and was placed on Levaquin. Despite which patient swelling worsened and came to the ER. At this time hand surgeon Dr. Merlyn LotKuzma has seen the patient has requested medical admission for uncontrolled diabetes management and patient will be taken to the OR for right thumb abscess incision and drainage. Patient states she has been having running elevated blood sugar recently and patient's primary care physician has recently increased her Levemir dose. And her recent hemoglobin A1c was 13. Patient otherwise denies any nausea vomiting abdominal pain diarrhea chest pain or shortness of breath. Denies any fever or chills.   Hospital Course:  Patient was admitted for observation with right thumb abscess. Dr. Merlyn LotKuzma with orthopedic surgery was consulted and patient was taken to OR and underwent I&D right  thumb with removal of nail plates. She did well postoperatively, pain well controlled on oral agents. Cultures obtained showed GPCs, speciation pending at the time of discharge. Patient very insistent to go home today, I discussed with Dr. Merlyn LotKuzma, he will see her in his office in 2-3 days, and she was discharged home in stable condition on Doxycycline for 7 additional days. Patient's DM is poorly controlled and her A1C most recently was 13, per patient. She thinks she can manage her diabetes well and is quite resistant to any suggestions regarding treatment changes and nutrition input as per RD. She exhibits poor insight. Her mild AKI resolved with hydration and she was advised to follow up with her PCP in 1-2 weeks.   Procedures:  I&D right thumb   Consultations:  Hand surgery   Discharge Exam: Filed Vitals:   08/24/14 0135 08/24/14 0158 08/24/14 0546 08/24/14 1054  BP: 138/68 165/80 151/70 148/56  Pulse: 76 81 79 89  Temp: 97.7 F (36.5 C) 98.6 F (37 C) 98.7 F (37.1 C) 97.7 F (36.5 C)  TempSrc:  Oral Oral Oral  Resp: 16 16 16 16   Height:  5\' 4"  (1.626 m)    Weight:  70.3 kg (154 lb 15.7 oz) 70.1 kg (154 lb 8.7 oz)   SpO2: 94% 94% 96% 96%   General: NAD Cardiovascular: RRR Respiratory: CTA biL  Discharge Instructions     Medication List    STOP taking these medications        levofloxacin 500 MG tablet  Commonly known as:  LEVAQUIN      TAKE these medications  ALPRAZolam 0.5 MG tablet  Commonly known as:  XANAX  Take 0.5 mg by mouth daily as needed. anxiety     amLODipine 10 MG tablet  Commonly known as:  NORVASC  Take 10 mg by mouth daily.     BYSTOLIC 10 MG tablet  Generic drug:  nebivolol  Take 10 mg by mouth daily.     doxycycline 100 MG capsule  Commonly known as:  VIBRAMYCIN  Take 1 capsule (100 mg total) by mouth 2 (two) times daily.     DULoxetine 30 MG capsule  Commonly known as:  CYMBALTA  Take 30 mg by mouth daily.      HYDROcodone-acetaminophen 5-325 MG per tablet  Commonly known as:  NORCO  Take 1 tablet by mouth every 6 (six) hours as needed for moderate pain.     LEVEMIR FLEXTOUCH 100 UNIT/ML Pen  Generic drug:  Insulin Detemir  Inject 40 Units into the skin 2 (two) times daily.     levothyroxine 50 MCG tablet  Commonly known as:  SYNTHROID, LEVOTHROID  Take 50 mcg by mouth daily.     REPATHA SURECLICK 140 MG/ML Soaj  Generic drug:  Evolocumab  Inject 140 mg into the skin every 28 (twenty-eight) days.     spironolactone 50 MG tablet  Commonly known as:  ALDACTONE  Take 50 mg by mouth daily.           Follow-up Information    Go to [facility] EMERGENCY DEPARTMENT.   Specialty:  Emergency Medicine   Why:  Now to see Dr. Merlyn Lot.   Contact information:   [address] [id number] mc [place] [address] [phone]      Follow up with Tami Ribas, MD In 2 days.   Specialty:  Orthopedic Surgery   Contact information:   [address] [place] [address] [phone]       Follow up with Ralene Ok, MD In 1 month.   Specialty:  Internal Medicine   Contact information:   [address] [place] [address] [phone]       The results of significant diagnostics from this hospitalization (including imaging, microbiology, ancillary and laboratory) are listed below for reference.    Significant Diagnostic Studies: Dg Finger Thumb Right  08/23/2014   CLINICAL DATA:  Distal thumb pain and swelling.  History of I&D.  EXAM: RIGHT THUMB 2+V  COMPARISON:  None.  FINDINGS: There is mild diffuse soft tissue swelling about the palmar aspect of the distal phalanx of the thumb. This finding is associated with an apparent small soft tissue laceration. No subcutaneous emphysema. No radiopaque foreign body. No discrete area of osteolysis to suggest osteomyelitis.  No fracture or dislocation. Mild-to-moderate degenerative change of the IP and MCP joints of  the thumb.  IMPRESSION: Soft tissue swelling and apparent soft tissue laceration about the palmar aspect of the distal phalanx of the thumb without associated radiopaque foreign body or evidence of osteomyelitis.   Electronically Signed   By: Simonne Come M.D.   On: 08/23/2014 21:11    Microbiology: Recent Results (from the past 240 hour(s))  Gram stain     Status: None   Collection Time: 08/24/14 12:44 AM  Result Value Ref Range Status   Specimen Description ABSCESS RIGHT THUMB  Final   Special Requests NONE  Final   Gram Stain   Final    MODERATE WBC PRESENT, PREDOMINANTLY PMN FEW GRAM POSITIVE COCCI IN PAIRS CALLED TO K.OSBORNE RN [phone] 08/24/14  E.GADDY    Report Status 08/24/2014 FINAL  Final  Culture, routine-abscess     Status: None (Preliminary result)   Collection Time: 08/24/14 12:44 AM  Result Value Ref Range Status   Specimen Description ABSCESS RIGHT THUMB  Final   Special Requests NONE  Final   Gram Stain   Final    MODERATE WBC PRESENT, PREDOMINANTLY PMN NO SQUAMOUS EPITHELIAL CELLS SEEN FEW GRAM POSITIVE COCCI IN PAIRS Performed at [facility] Gram Stain Report Called to,Read Back By and Verified With: Gram Stain Report Called to,Read Back By and Verified With: Jettie Pagan RN [phone] 08/24/14 E. GADDY Performed at [facility]    Culture PENDING  Incomplete   Report Status PENDING  Incomplete     Labs: Basic Metabolic Panel:  Recent Labs Lab 08/23/14 2028 08/24/14 0454  NA 131* 135  K 4.9 3.8  CL 97* 104  CO2 21* 23  GLUCOSE 372* 263*  BUN 27* 16  CREATININE 1.23* 0.87  CALCIUM 10.4* 9.6   Liver Function Tests:  Recent Labs Lab 08/24/14 0454  AST 19  ALT 20  ALKPHOS 40  BILITOT 0.3  PROT 6.3*  ALBUMIN 3.0*   CBC:  Recent Labs Lab 08/23/14 2028 08/24/14 0454  WBC 6.0 4.3  NEUTROABS 3.6 2.7  HGB 16.6* 14.5  HCT 46.7* 41.7  MCV 92.1 91.6  PLT 78* 61*   CBG:  Recent Labs Lab 08/24/14 0109 08/24/14 0157 08/24/14 0621  08/24/14 0800 08/24/14 1115  GLUCAP 248* 224* 240* 292* 322*    Signed:  Mylin Hirano  [facility] 08/24/2014, 3:17 PM

## 2014-08-24 NOTE — Discharge Instructions (Addendum)
Fingertip Infection When an infection is around the nail, it is called a paronychia. When it appears over the tip of the finger, it is called a felon. These infections are due to minor injuries or cracks in the skin. If they are not treated properly, they can lead to bone infection and permanent damage to the fingernail. Incision and drainage is necessary if a pus pocket (an abscess) has formed. Antibiotics and pain medicine may also be needed. Keep your hand elevated for the next 2-3 days to reduce swelling and pain. If a pack was placed in the abscess, it should be removed in 1-2 days by your caregiver. Soak the finger in warm water for 20 minutes 4 times daily to help promote drainage. Keep the hands as dry as possible. Wear protective gloves with cotton liners. See your caregiver for follow-up care as recommended.  HOME CARE INSTRUCTIONS   Keep wound clean, dry and dressed as suggested by your caregiver.  Soak in warm salt water for fifteen minutes, four times per day for bacterial infections.  Your caregiver will prescribe an antibiotic if a bacterial infection is suspected. Take antibiotics as directed and finish the prescription, even if the problem appears to be improving before the medicine is gone.  Only take over-the-counter or prescription medicines for pain, discomfort, or fever as directed by your caregiver. SEEK IMMEDIATE MEDICAL CARE IF:  There is redness, swelling, or increasing pain in the wound.  Pus or any other unusual drainage is coming from the wound.  An unexplained oral temperature above 102 F (38.9 C) develops.  You notice a foul smell coming from the wound or dressing. MAKE SURE YOU:   Understand these instructions.  Monitor your condition.  Contact your caregiver if you are getting worse or not improving. Document Released: 04/27/2004 Document Revised: 06/12/2011 Document Reviewed: 04/23/2008 [facility] Patient Information 2015 [place], [place]. This  information is not intended to replace advice given to you by your health care provider. Make sure you discuss any questions you have with your health care provider.  [facility] Instructions Hand Surgery  Wound Care: Keep your hand elevated above the level of your heart.  Do not allow it to dangle by your side.  Keep the dressing dry and do not remove it unless your doctor advises you to do so.  He will usually change it at the time of your post-op visit.  Moving your fingers is advised to stimulate circulation but will depend on the site of your surgery.  If you have a splint applied, your doctor will advise you regarding movement.  Activity: Do not drive or operate machinery today.  Rest today and then you may return to your normal activity and work as indicated by your physician.  Diet:  Drink liquids today or eat a light diet.  You may resume a regular diet tomorrow.    General expectations: Pain for two to three days. Fingers may become slightly swollen.  Call your doctor if any of the following occur: Severe pain not relieved by pain medication. Elevated temperature. Dressing soaked with blood. Inability to move fingers. White or bluish color to fingers.    Follow with Ralene Ok, MD in 5-7 days  Please get a complete blood count and chemistry panel checked by your Primary MD at your next visit, and again as instructed by your Primary MD. Please get your medications reviewed and adjusted by your Primary MD.  Please request your Primary MD to go over all Hospital Tests  and Procedure/Radiological results at the follow up, please get all Hospital records sent to your Prim MD by signing hospital release before you go home.  If you had Pneumonia of Lung problems at the Hospital: Please get a 2 view Chest X ray done in 6-8 weeks after hospital discharge or sooner if instructed by your Primary MD.  If you have Congestive Heart Failure: Please call your Cardiologist or Primary MD  anytime you have any of the following symptoms:  1) 3 pound weight gain in 24 hours or 5 pounds in 1 week  2) shortness of breath, with or without a dry hacking cough  3) swelling in the hands, feet or stomach  4) if you have to sleep on extra pillows at night in order to breathe  Follow cardiac low salt diet and 1.5 lit/day fluid restriction.  If you have diabetes Accuchecks 4 times/day, Once in AM empty stomach and then before each meal. Log in all results and show them to your primary doctor at your next visit. If any glucose reading is under 80 or above 300 call your primary MD immediately.  If you have Seizure/Convulsions/Epilepsy: Please do not drive, operate heavy machinery, participate in activities at heights or participate in high speed sports until you have seen by Primary MD or a Neurologist and advised to do so again.  If you had Gastrointestinal Bleeding: Please ask your Primary MD to check a complete blood count within one week of discharge or at your next visit. Your endoscopic/colonoscopic biopsies that are pending at the time of discharge, will also need to followed by your Primary MD.  Get Medicines reviewed and adjusted. Please take all your medications with you for your next visit with your Primary MD  Please request your Primary MD to go over all hospital tests and procedure/radiological results at the follow up, please ask your Primary MD to get all Hospital records sent to his/her office.  If you experience worsening of your admission symptoms, develop shortness of breath, life threatening emergency, suicidal or homicidal thoughts you must seek medical attention immediately by calling 911 or calling your MD immediately  if symptoms less severe.  You must read complete instructions/literature along with all the possible adverse reactions/side effects for all the Medicines you take and that have been prescribed to you. Take any new Medicines after you have completely  understood and accpet all the possible adverse reactions/side effects.   Do not drive or operate heavy machinery when taking Pain medications.   Do not take more than prescribed Pain, Sleep and Anxiety Medications  Special Instructions: If you have smoked or chewed Tobacco  in the last 2 yrs please stop smoking, stop any regular Alcohol  and or any Recreational drug use.  Wear Seat belts while driving.  Please note You were cared for by a hospitalist during your hospital stay. If you have any questions about your discharge medications or the care you received while you were in the hospital after you are discharged, you can call the unit and asked to speak with the hospitalist on call if the hospitalist that took care of you is not available. Once you are discharged, your primary care physician will handle any further medical issues. Please note that NO REFILLS for any discharge medications will be authorized once you are discharged, as it is imperative that you return to your primary care physician (or establish a relationship with a primary care physician if you do not have one) for  your aftercare needs so that they can reassess your need for medications and monitor your lab values.  You can reach the hospitalist office at phone [phone] or fax [phone]   If you do not have a primary care physician, you can call [phone] for a physician referral.  Activity: As tolerated with Full fall precautions use walker/cane & assistance as needed  Diet: diabetic  Disposition Home

## 2014-08-25 ENCOUNTER — Encounter (HOSPITAL_COMMUNITY): Payer: Self-pay | Admitting: Orthopedic Surgery

## 2014-08-27 LAB — CULTURE, ROUTINE-ABSCESS

## 2014-08-29 LAB — ANAEROBIC CULTURE

## 2014-09-10 ENCOUNTER — Encounter (HOSPITAL_BASED_OUTPATIENT_CLINIC_OR_DEPARTMENT_OTHER): Payer: Self-pay | Admitting: *Deleted

## 2014-09-10 ENCOUNTER — Other Ambulatory Visit: Payer: Self-pay | Admitting: Orthopedic Surgery

## 2014-09-10 NOTE — Progress Notes (Signed)
Bring all medications. Will need I stat and EKG tomorrow day of surgery.

## 2014-09-11 ENCOUNTER — Encounter (HOSPITAL_BASED_OUTPATIENT_CLINIC_OR_DEPARTMENT_OTHER)
Admission: RE | Disposition: A | Discharge status: Home / Self Care | Payer: Self-pay | Source: Ambulatory Visit | Attending: Orthopedic Surgery

## 2014-09-11 ENCOUNTER — Ambulatory Visit (HOSPITAL_BASED_OUTPATIENT_CLINIC_OR_DEPARTMENT_OTHER)
Admission: RE | Admit: 2014-09-11 | Discharge: 2014-09-11 | Disposition: A | Discharge status: Home / Self Care | Payer: Medicare Other | Source: Ambulatory Visit | Attending: Orthopedic Surgery | Admitting: Orthopedic Surgery

## 2014-09-11 ENCOUNTER — Encounter (HOSPITAL_BASED_OUTPATIENT_CLINIC_OR_DEPARTMENT_OTHER): Payer: Self-pay | Admitting: *Deleted

## 2014-09-11 ENCOUNTER — Ambulatory Visit (HOSPITAL_BASED_OUTPATIENT_CLINIC_OR_DEPARTMENT_OTHER): Payer: Medicare Other | Admitting: Anesthesiology

## 2014-09-11 DIAGNOSIS — Z792 Long term (current) use of antibiotics: Secondary | ICD-10-CM | POA: Insufficient documentation

## 2014-09-11 DIAGNOSIS — E039 Hypothyroidism, unspecified: Secondary | ICD-10-CM | POA: Insufficient documentation

## 2014-09-11 DIAGNOSIS — Z79899 Other long term (current) drug therapy: Secondary | ICD-10-CM | POA: Insufficient documentation

## 2014-09-11 DIAGNOSIS — E119 Type 2 diabetes mellitus without complications: Secondary | ICD-10-CM | POA: Diagnosis not present

## 2014-09-11 DIAGNOSIS — Z79891 Long term (current) use of opiate analgesic: Secondary | ICD-10-CM | POA: Diagnosis not present

## 2014-09-11 DIAGNOSIS — Z96651 Presence of right artificial knee joint: Secondary | ICD-10-CM | POA: Insufficient documentation

## 2014-09-11 DIAGNOSIS — I10 Essential (primary) hypertension: Secondary | ICD-10-CM | POA: Diagnosis not present

## 2014-09-11 DIAGNOSIS — F419 Anxiety disorder, unspecified: Secondary | ICD-10-CM | POA: Insufficient documentation

## 2014-09-11 DIAGNOSIS — L02511 Cutaneous abscess of right hand: Secondary | ICD-10-CM | POA: Diagnosis not present

## 2014-09-11 DIAGNOSIS — L089 Local infection of the skin and subcutaneous tissue, unspecified: Secondary | ICD-10-CM | POA: Diagnosis present

## 2014-09-11 DIAGNOSIS — F17210 Nicotine dependence, cigarettes, uncomplicated: Secondary | ICD-10-CM | POA: Diagnosis not present

## 2014-09-11 HISTORY — DX: Anxiety disorder, unspecified: F41.9

## 2014-09-11 HISTORY — DX: Hypothyroidism, unspecified: E03.9

## 2014-09-11 HISTORY — DX: Personal history of other diseases of the digestive system: Z87.19

## 2014-09-11 HISTORY — PX: INCISION AND DRAINAGE: SHX5863

## 2014-09-11 LAB — POCT I-STAT, CHEM 8
BUN: 22 mg/dL — ABNORMAL HIGH (ref 6–20)
CALCIUM ION: 1.45 mmol/L — AB (ref 1.13–1.30)
Chloride: 99 mmol/L — ABNORMAL LOW (ref 101–111)
Creatinine, Ser: 0.9 mg/dL (ref 0.44–1.00)
GLUCOSE: 390 mg/dL — AB (ref 65–99)
HCT: 55 % — ABNORMAL HIGH (ref 36.0–46.0)
Hemoglobin: 18.7 g/dL — ABNORMAL HIGH (ref 12.0–15.0)
Potassium: 4.3 mmol/L (ref 3.5–5.1)
SODIUM: 134 mmol/L — AB (ref 135–145)
TCO2: 20 mmol/L (ref 0–100)

## 2014-09-11 LAB — GLUCOSE, CAPILLARY
GLUCOSE-CAPILLARY: 328 mg/dL — AB (ref 65–99)
Glucose-Capillary: 350 mg/dL — ABNORMAL HIGH (ref 65–99)

## 2014-09-11 SURGERY — INCISION AND DRAINAGE
Anesthesia: General | Site: Thumb | Laterality: Right

## 2014-09-11 MED ORDER — FENTANYL CITRATE (PF) 100 MCG/2ML IJ SOLN
INTRAMUSCULAR | Status: AC
  Filled 2014-09-11: qty 6

## 2014-09-11 MED ORDER — SCOPOLAMINE 1 MG/3DAYS TD PT72: 1.0000 | MEDICATED_PATCH | Freq: Once | TRANSDERMAL | Status: DC | PRN

## 2014-09-11 MED ORDER — PROPOFOL 10 MG/ML IV BOLUS
INTRAVENOUS | Status: DC | PRN
  Administered 2014-09-11: 150 mg via INTRAVENOUS

## 2014-09-11 MED ORDER — LACTATED RINGERS IV SOLN
INTRAVENOUS | Status: DC
  Administered 2014-09-11: 13:00:00 via INTRAVENOUS

## 2014-09-11 MED ORDER — HYDROMORPHONE HCL 1 MG/ML IJ SOLN
INTRAMUSCULAR | Status: AC
  Filled 2014-09-11: qty 1

## 2014-09-11 MED ORDER — LACTATED RINGERS IV SOLN: 500.0000 mL | INTRAVENOUS | Status: DC

## 2014-09-11 MED ORDER — HYDROCODONE-ACETAMINOPHEN 5-325 MG PO TABS: ORAL_TABLET | ORAL | Status: DC

## 2014-09-11 MED ORDER — OXYCODONE HCL 5 MG PO TABS
5.0000 mg | ORAL_TABLET | Freq: Once | ORAL | Status: AC | PRN
  Administered 2014-09-11: 5 mg via ORAL

## 2014-09-11 MED ORDER — CEFAZOLIN SODIUM-DEXTROSE 2-3 GM-% IV SOLR
2.0000 g | INTRAVENOUS | Status: AC
  Administered 2014-09-11: 2 g via INTRAVENOUS

## 2014-09-11 MED ORDER — 0.9 % SODIUM CHLORIDE (POUR BTL) OPTIME
TOPICAL | Status: DC | PRN
  Administered 2014-09-11: 120 mL

## 2014-09-11 MED ORDER — ACETAMINOPHEN 160 MG/5ML PO SOLN: 960.0000 mg | Freq: Once | ORAL | Status: DC

## 2014-09-11 MED ORDER — BUPIVACAINE HCL (PF) 0.25 % IJ SOLN
INTRAMUSCULAR | Status: DC | PRN
  Administered 2014-09-11: 7 mL

## 2014-09-11 MED ORDER — AMOXICILLIN-POT CLAVULANATE 875-125 MG PO TABS: 1.0000 | ORAL_TABLET | Freq: Two times a day (BID) | ORAL | Status: DC

## 2014-09-11 MED ORDER — OXYCODONE HCL 5 MG PO TABS
ORAL_TABLET | ORAL | Status: AC
  Filled 2014-09-11: qty 1

## 2014-09-11 MED ORDER — BUPIVACAINE HCL (PF) 0.25 % IJ SOLN
INTRAMUSCULAR | Status: AC
  Filled 2014-09-11: qty 30

## 2014-09-11 MED ORDER — MIDAZOLAM HCL 2 MG/2ML IJ SOLN
INTRAMUSCULAR | Status: AC
  Filled 2014-09-11: qty 2

## 2014-09-11 MED ORDER — MIDAZOLAM HCL 2 MG/2ML IJ SOLN
1.0000 mg | INTRAMUSCULAR | Status: DC | PRN
Start: 2014-09-11 — End: 2014-09-11

## 2014-09-11 MED ORDER — ACETAMINOPHEN 500 MG PO TABS: 1000.0000 mg | ORAL_TABLET | Freq: Once | ORAL | Status: DC

## 2014-09-11 MED ORDER — OXYCODONE HCL 5 MG/5ML PO SOLN: 5.0000 mg | Freq: Once | ORAL | Status: AC | PRN

## 2014-09-11 MED ORDER — LIDOCAINE HCL (CARDIAC) 20 MG/ML IV SOLN
INTRAVENOUS | Status: DC | PRN
  Administered 2014-09-11: 50 mg via INTRAVENOUS

## 2014-09-11 MED ORDER — CHLORHEXIDINE GLUCONATE 4 % EX LIQD: 60.0000 mL | Freq: Once | CUTANEOUS | Status: DC

## 2014-09-11 MED ORDER — HYDROMORPHONE HCL 1 MG/ML IJ SOLN
0.2500 mg | INTRAMUSCULAR | Status: DC | PRN
  Administered 2014-09-11: 0.25 mg via INTRAVENOUS

## 2014-09-11 MED ORDER — MEPERIDINE HCL 25 MG/ML IJ SOLN: 6.2500 mg | INTRAMUSCULAR | Status: DC | PRN

## 2014-09-11 MED ORDER — FENTANYL CITRATE (PF) 100 MCG/2ML IJ SOLN
INTRAMUSCULAR | Status: DC | PRN
  Administered 2014-09-11: 25 ug via INTRAVENOUS
  Administered 2014-09-11: 50 ug via INTRAVENOUS

## 2014-09-11 MED ORDER — INSULIN ASPART 100 UNIT/ML ~~LOC~~ SOLN
5.0000 [IU] | Freq: Once | SUBCUTANEOUS | Status: AC
  Administered 2014-09-11: 5 [IU] via INTRAVENOUS

## 2014-09-11 SURGICAL SUPPLY — 54 items
BAG DECANTER FOR FLEXI CONT (MISCELLANEOUS) IMPLANT
BANDAGE ELASTIC 3 VELCRO ST LF (GAUZE/BANDAGES/DRESSINGS) IMPLANT
BLADE MINI RND TIP GREEN BEAV (BLADE) IMPLANT
BLADE SURG 15 STRL LF DISP TIS (BLADE) ×2 IMPLANT
BLADE SURG 15 STRL SS (BLADE) ×4
BNDG CMPR 9X4 STRL LF SNTH (GAUZE/BANDAGES/DRESSINGS) ×1
BNDG CMPR MD 5X2 ELC HKLP STRL (GAUZE/BANDAGES/DRESSINGS)
BNDG COHESIVE 1X5 TAN STRL LF (GAUZE/BANDAGES/DRESSINGS) ×1 IMPLANT
BNDG ELASTIC 2 VLCR STRL LF (GAUZE/BANDAGES/DRESSINGS) IMPLANT
BNDG ESMARK 4X9 LF (GAUZE/BANDAGES/DRESSINGS) ×1 IMPLANT
BNDG GAUZE 1X2.1 STRL (MISCELLANEOUS) IMPLANT
BNDG GAUZE ELAST 4 BULKY (GAUZE/BANDAGES/DRESSINGS) IMPLANT
CHLORAPREP W/TINT 26ML (MISCELLANEOUS) ×1 IMPLANT
CORDS BIPOLAR (ELECTRODE) ×2 IMPLANT
COVER BACK TABLE 60X90IN (DRAPES) ×2 IMPLANT
COVER MAYO STAND STRL (DRAPES) ×2 IMPLANT
CUFF TOURNIQUET SINGLE 18IN (TOURNIQUET CUFF) ×2 IMPLANT
DRAPE EXTREMITY T 121X128X90 (DRAPE) ×2 IMPLANT
DRAPE SURG 17X23 STRL (DRAPES) ×2 IMPLANT
GAUZE PACKING IODOFORM 1/4X15 (GAUZE/BANDAGES/DRESSINGS) ×1 IMPLANT
GAUZE SPONGE 4X4 12PLY STRL (GAUZE/BANDAGES/DRESSINGS) ×2 IMPLANT
GAUZE XEROFORM 1X8 LF (GAUZE/BANDAGES/DRESSINGS) ×2 IMPLANT
GLOVE BIO SURGEON STRL SZ7.5 (GLOVE) ×2 IMPLANT
GLOVE BIOGEL M STRL SZ7.5 (GLOVE) ×1 IMPLANT
GLOVE BIOGEL PI IND STRL 7.0 (GLOVE) IMPLANT
GLOVE BIOGEL PI IND STRL 8 (GLOVE) ×2 IMPLANT
GLOVE BIOGEL PI INDICATOR 7.0 (GLOVE) ×1
GLOVE BIOGEL PI INDICATOR 8 (GLOVE) ×2
GLOVE ECLIPSE 6.5 STRL STRAW (GLOVE) ×1 IMPLANT
GOWN STRL REUS W/ TWL LRG LVL3 (GOWN DISPOSABLE) ×1 IMPLANT
GOWN STRL REUS W/ TWL XL LVL3 (GOWN DISPOSABLE) IMPLANT
GOWN STRL REUS W/TWL LRG LVL3 (GOWN DISPOSABLE)
GOWN STRL REUS W/TWL XL LVL3 (GOWN DISPOSABLE) ×4 IMPLANT
LOOP VESSEL MAXI BLUE (MISCELLANEOUS) IMPLANT
NDL HYPO 25X1 1.5 SAFETY (NEEDLE) IMPLANT
NEEDLE HYPO 25X1 1.5 SAFETY (NEEDLE) ×2 IMPLANT
NS IRRIG 1000ML POUR BTL (IV SOLUTION) ×2 IMPLANT
PACK BASIN DAY SURGERY FS (CUSTOM PROCEDURE TRAY) ×2 IMPLANT
PAD CAST 3X4 CTTN HI CHSV (CAST SUPPLIES) IMPLANT
PADDING CAST ABS 4INX4YD NS (CAST SUPPLIES) ×1
PADDING CAST ABS COTTON 4X4 ST (CAST SUPPLIES) ×1 IMPLANT
PADDING CAST COTTON 3X4 STRL (CAST SUPPLIES)
SPLINT PLASTER CAST XFAST 3X15 (CAST SUPPLIES) IMPLANT
SPLINT PLASTER XTRA FASTSET 3X (CAST SUPPLIES)
STOCKINETTE 4X48 STRL (DRAPES) ×2 IMPLANT
SUT ETHILON 4 0 PS 2 18 (SUTURE) IMPLANT
SWAB COLLECTION DEVICE MRSA (MISCELLANEOUS) ×1 IMPLANT
SYR 20CC LL (SYRINGE) IMPLANT
SYR BULB 3OZ (MISCELLANEOUS) ×2 IMPLANT
SYR CONTROL 10ML LL (SYRINGE) ×1 IMPLANT
TOWEL OR 17X24 6PK STRL BLUE (TOWEL DISPOSABLE) ×4 IMPLANT
TUBE ANAEROBIC SPECIMEN COL (MISCELLANEOUS) ×2 IMPLANT
TUBE FEEDING 5FR 15 INCH (TUBING) IMPLANT
UNDERPAD 30X30 (UNDERPADS AND DIAPERS) ×2 IMPLANT

## 2014-09-11 NOTE — H&P (Signed)
Krista Arroyo is an 74 y.o. female.   Chief Complaint: right thumb infection HPI: 74 yo female s/p incision and drainage of right thumb for abscess that started on a cruise.  Has had re-accumulation of purulence.  No fevers, chills, night sweats.  Past Medical History  Diagnosis Date  . Diabetes mellitus without complication   . Hypertension   . Hypothyroidism   . Anxiety   . History of hiatal hernia     Past Surgical History  Procedure Laterality Date  . Abdominal hysterectomy    . Renal stent    . I&d extremity Right 08/23/2014    Procedure: IRRIGATION AND DEBRIDEMENT RIGHT THUMB;  Surgeon: Krista Loa, MD;  Location: [facility] OR;  Service: Orthopedics;  Laterality: Right;  . Right knee replacement      Family History  Problem Relation Age of Onset  . Diabetes Mellitus II Mother    Social History:  reports that she has been smoking Cigarettes.  She has been smoking about 0.25 packs per day. She does not have any smokeless tobacco history on file. She reports that she does not drink alcohol or use illicit drugs.  Allergies:  Allergies  Allergen Reactions  . Influenza Vaccines     Medications Prior to Admission  Medication Sig Dispense Refill  . ALPRAZolam (XANAX) 0.5 MG tablet Take 0.5 mg by mouth as needed. anxiety  0  . amLODipine (NORVASC) 10 MG tablet Take 10 mg by mouth daily.  5  . BYSTOLIC 10 MG tablet Take 10 mg by mouth daily.  4  . doxycycline (VIBRAMYCIN) 100 MG capsule Take 1 capsule (100 mg total) by mouth 2 (two) times daily. 14 capsule 0  . DULoxetine (CYMBALTA) 30 MG capsule Take 30 mg by mouth daily.  5  . furosemide (LASIX) 10 MG/ML solution Take by mouth daily.    Marland Kitchen HYDROcodone-acetaminophen (NORCO) 5-325 MG per tablet Take 1 tablet by mouth every 6 (six) hours as needed for moderate pain. 20 tablet 0  . LEVEMIR FLEXTOUCH 100 UNIT/ML Pen Inject 40 Units into the skin 2 (two) times daily.    Marland Kitchen levothyroxine (SYNTHROID, LEVOTHROID) 50 MCG tablet Take 50  mcg by mouth daily.  2  . metFORMIN (GLUCOPHAGE) 500 MG tablet Take 500 mg by mouth 1 day or 1 dose.    . spironolactone (ALDACTONE) 50 MG tablet Take 50 mg by mouth daily.  2  . REPATHA SURECLICK 140 MG/ML SOAJ Inject 140 mg into the skin every 28 (twenty-eight) days.      Results for orders placed or performed during the hospital encounter of 09/11/14 (from the past 48 hour(s))  I-STAT, chem 8     Status: Abnormal   Collection Time: 09/11/14 12:35 PM  Result Value Ref Range   Sodium 134 (L) 135 - 145 mmol/L   Potassium 4.3 3.5 - 5.1 mmol/L   Chloride 99 (L) 101 - 111 mmol/L   BUN 22 (H) 6 - 20 mg/dL   Creatinine, Ser 5.92 0.44 - 1.00 mg/dL   Glucose, Bld 924 (H) 65 - 99 mg/dL   Calcium, Ion 4.62 (H) 1.13 - 1.30 mmol/L   TCO2 20 0 - 100 mmol/L   Hemoglobin 18.7 (H) 12.0 - 15.0 g/dL   HCT 86.3 (H) 81.7 - 71.1 %    No results found.   A comprehensive review of systems was negative.  Blood pressure 198/77, pulse 80, temperature 98.2 F (36.8 C), temperature source Oral, resp. rate 20, height 5\' 3"  (  1.6 m), weight 69.4 kg (153 lb), SpO2 98 %.  General appearance: alert, cooperative and appears stated age Head: Normocephalic, without obvious abnormality, atraumatic Neck: supple, symmetrical, trachea midline Resp: clear to auscultation bilaterally Cardio: regular rate and rhythm GI: non tender Extremities: intact sensation and capillary refill all digits.  +epl/fpl/io.  ttp distal phalanx.  no proximal ttp.  can flex and extend at ip joint.  wound open with purulence. Pulses: 2+ and symmetric Skin: Skin color, texture, turgor normal. No rashes or lesions Neurologic: Grossly normal Incision/Wound: As above  Assessment/Plan Right thumb abscess.  Recommend repeat incision and drainage in OR.  Non operative and operative treatment options were discussed with the patient and patient wishes to proceed with operative treatment. Risks, benefits, and alternatives of surgery were  discussed and the patient agrees with the plan of care.   Krista Arroyo R 09/11/2014, 1:46 PM

## 2014-09-11 NOTE — Transfer of Care (Signed)
Immediate Anesthesia Transfer of Care Note  Patient: Krista Arroyo  Procedure(s) Performed: Procedure(s): INCISION AND DRAINAGE RIGHT THUMB (Right)  Patient Location: PACU  Anesthesia Type:General  Level of Consciousness: sedated  Airway & Oxygen Therapy: Patient Spontanous Breathing and Patient connected to face mask oxygen  Post-op Assessment: Report given to RN and Post -op Vital signs reviewed and stable  Post vital signs: Reviewed and stable  Last Vitals:  Filed Vitals:   09/11/14 1249  BP: 198/77  Pulse: 80  Temp: 36.8 C  Resp: 20    Complications: No apparent anesthesia complications

## 2014-09-11 NOTE — Discharge Instructions (Addendum)
[facility] Instructions °Hand Surgery ° °Wound Care: °Keep your hand elevated above the level of your heart.  Do not allow it to dangle by your side.  Keep the dressing dry and do not remove it unless your doctor advises you to do so.  He will usually change it at the time of your post-op visit.  Moving your fingers is advised to stimulate circulation but will depend on the site of your surgery.  If you have a splint applied, your doctor will advise you regarding movement. ° °Activity: °Do not drive or operate machinery today.  Rest today and then you may return to your normal activity and work as indicated by your physician. ° °Diet:  °Drink liquids today or eat a light diet.  You may resume a regular diet tomorrow.   ° °General expectations: °Pain for two to three days. °Fingers may become slightly swollen. ° °Call your doctor if any of the following occur: °Severe pain not relieved by pain medication. °Elevated temperature. °Dressing soaked with blood. °Inability to move fingers. °White or bluish color to fingers. ° ° °Post Anesthesia Home Care Instructions ° °Activity: °Get plenty of rest for the remainder of the day. A responsible adult should stay with you for 24 hours following the procedure.  °For the next 24 hours, DO NOT: °-Drive a car °-Operate machinery °-Drink alcoholic beverages °-Take any medication unless instructed by your physician °-Make any legal decisions or sign important papers. ° °Meals: °Start with liquid foods such as gelatin or soup. Progress to regular foods as tolerated. Avoid greasy, spicy, heavy foods. If nausea and/or vomiting occur, drink only clear liquids until the nausea and/or vomiting subsides. Call your physician if vomiting continues. ° °Special Instructions/Symptoms: °Your throat may feel dry or sore from the anesthesia or the breathing tube placed in your throat during surgery. If this causes discomfort, gargle with warm salt water. The discomfort should disappear within 24  hours. ° °If you had a scopolamine patch placed behind your ear for the management of post- operative nausea and/or vomiting: ° °1. The medication in the patch is effective for 72 hours, after which it should be removed.  Wrap patch in a tissue and discard in the trash. Wash hands thoroughly with soap and water. °2. You may remove the patch earlier than 72 hours if you experience unpleasant side effects which may include dry mouth, dizziness or visual disturbances. °3. Avoid touching the patch. Wash your hands with soap and water after contact with the patch. °  ° ° °Post Anesthesia Home Care Instructions ° °Activity: °Get plenty of rest for the remainder of the day. A responsible adult should stay with you for 24 hours following the procedure.  °For the next 24 hours, DO NOT: °-Drive a car °-Operate machinery °-Drink alcoholic beverages °-Take any medication unless instructed by your physician °-Make any legal decisions or sign important papers. ° °Meals: °Start with liquid foods such as gelatin or soup. Progress to regular foods as tolerated. Avoid greasy, spicy, heavy foods. If nausea and/or vomiting occur, drink only clear liquids until the nausea and/or vomiting subsides. Call your physician if vomiting continues. ° °Special Instructions/Symptoms: °Your throat may feel dry or sore from the anesthesia or the breathing tube placed in your throat during surgery. If this causes discomfort, gargle with warm salt water. The discomfort should disappear within 24 hours. ° °If you had a scopolamine patch placed behind your ear for the management of post- operative nausea and/or vomiting: ° °  The medication in the patch is effective for 72 hours, after which it should be removed.  Wrap patch in a tissue and discard in the trash. Wash hands thoroughly with soap and water. 2. You may remove the patch earlier than 72 hours if you experience unpleasant side effects which may include dry mouth, dizziness or visual  disturbances. 3. Avoid touching the patch. Wash your hands with soap and water after contact with the patch.    Post Anesthesia Home Care Instructions  Activity: Get plenty of rest for the remainder of the day. A responsible adult should stay with you for 24 hours following the procedure.  For the next 24 hours, DO NOT: -Drive a car -Advertising copywriter -Drink alcoholic beverages -Take any medication unless instructed by your physician -Make any legal decisions or sign important papers.  Meals: Start with liquid foods such as gelatin or soup. Progress to regular foods as tolerated. Avoid greasy, spicy, heavy foods. If nausea and/or vomiting occur, drink only clear liquids until the nausea and/or vomiting subsides. Call your physician if vomiting continues.  Special Instructions/Symptoms: Your throat may feel dry or sore from the anesthesia or the breathing tube placed in your throat during surgery. If this causes discomfort, gargle with warm salt water. The discomfort should disappear within 24 hours.  If you had a scopolamine patch placed behind your ear for the management of post- operative nausea and/or vomiting:  1. The medication in the patch is effective for 72 hours, after which it should be removed.  Wrap patch in a tissue and discard in the trash. Wash hands thoroughly with soap and water. 2. You may remove the patch earlier than 72 hours if you experience unpleasant side effects which may include dry mouth, dizziness or visual disturbances. 3. Avoid touching the patch. Wash your hands with soap and water after contact with the patch.

## 2014-09-11 NOTE — Brief Op Note (Signed)
09/11/2014  2:36 PM  PATIENT:  Krista Arroyo  74 y.o. female  PRE-OPERATIVE DIAGNOSIS:  CHRONIC INFECTION RIGHT THUMB  POST-OPERATIVE DIAGNOSIS:  CHRONIC INFECTION RIGHT THUMB  PROCEDURE:  Procedure(s): INCISION AND DRAINAGE RIGHT THUMB (Right)  SURGEON:  Surgeon(s) and Role:    * Betha Loa, MD - Primary  PHYSICIAN ASSISTANT:   ASSISTANTS: none   ANESTHESIA:   general  EBL:     BLOOD ADMINISTERED:none  DRAINS: iodoform packing  LOCAL MEDICATIONS USED:  MARCAINE     SPECIMEN:  Source of Specimen:  right thumb  DISPOSITION OF SPECIMEN:  micro  COUNTS:  YES  TOURNIQUET:   Total Tourniquet Time Documented: Forearm (Right) - 10 minutes Total: Forearm (Right) - 10 minutes   DICTATION: .Other Dictation: Dictation Number [id number]  PLAN OF CARE: Discharge to home after PACU  PATIENT DISPOSITION:  PACU - hemodynamically stable.

## 2014-09-11 NOTE — Anesthesia Procedure Notes (Signed)
Procedure Name: LMA Insertion Date/Time: 09/11/2014 2:09 PM Performed by: Zenia Resides D Pre-anesthesia Checklist: Patient identified, Emergency Drugs available, Suction available and Patient being monitored Patient Re-evaluated:Patient Re-evaluated prior to inductionOxygen Delivery Method: Circle System Utilized Preoxygenation: Pre-oxygenation with 100% oxygen Intubation Type: IV induction Ventilation: Mask ventilation without difficulty LMA: LMA inserted LMA Size: 4.0 Number of attempts: 1 Airway Equipment and Method: Bite block Placement Confirmation: positive ETCO2 Tube secured with: Tape Dental Injury: Teeth and Oropharynx as per pre-operative assessment

## 2014-09-11 NOTE — Anesthesia Postprocedure Evaluation (Signed)
Anesthesia Post Note  Patient: Krista Arroyo  Procedure(s) Performed: Procedure(s) (LRB): INCISION AND DRAINAGE RIGHT THUMB (Right)  Anesthesia type: general  Patient location: PACU  Post pain: Pain level controlled  Post assessment: Patient's Cardiovascular Status Stable  Last Vitals:  Filed Vitals:   09/11/14 1545  BP: 148/57  Pulse: 79  Temp:   Resp: 14    Post vital signs: Reviewed and stable  Level of consciousness: sedated  Complications: No apparent anesthesia complications

## 2014-09-11 NOTE — Anesthesia Preprocedure Evaluation (Signed)
Anesthesia Evaluation  Patient identified by MRN, date of birth, ID band Patient awake    Reviewed: Allergy & Precautions, NPO status , Patient's Chart, lab work & pertinent test results  Airway Mallampati: I  TM Distance: >3 FB Neck ROM: Full    Dental  (+) Teeth Intact, Dental Advisory Given   Pulmonary Current Smoker,  breath sounds clear to auscultation        Cardiovascular hypertension, Pt. on medications and Pt. on home beta blockers Rhythm:Regular Rate:Normal     Neuro/Psych    GI/Hepatic   Endo/Other  diabetes, Poorly Controlled, Type 2, Insulin Dependent  Renal/GU      Musculoskeletal   Abdominal   Peds  Hematology   Anesthesia Other Findings   Reproductive/Obstetrics                             Anesthesia Physical Anesthesia Plan  ASA: III  Anesthesia Plan: General   Post-op Pain Management:    Induction: Intravenous  Airway Management Planned: LMA  Additional Equipment:   Intra-op Plan:   Post-operative Plan: Extubation in OR  Informed Consent: I have reviewed the patients History and Physical, chart, labs and discussed the procedure including the risks, benefits and alternatives for the proposed anesthesia with the patient or authorized representative who has indicated his/her understanding and acceptance.   Dental advisory given  Plan Discussed with: CRNA, Anesthesiologist and Surgeon  Anesthesia Plan Comments:         Anesthesia Quick Evaluation

## 2014-09-11 NOTE — Op Note (Signed)
[id number] 

## 2014-09-12 NOTE — Op Note (Signed)
NAME:  Krista Arroyo, Krista Arroyo NO.:  [id number]  MEDICAL RECORD NO.:  [id number]  LOCATION:                               FACILITY:  [facility]  PHYSICIAN:  Betha Loa, MD        DATE OF BIRTH:  Aug 01, 1940  DATE OF PROCEDURE:  09/11/2014 DATE OF DISCHARGE:  09/11/2014                              OPERATIVE REPORT   PREOPERATIVE DIAGNOSIS:  Right thumb continued infection.  POSTOPERATIVE DIAGNOSIS:  Right thumb continued infection.  PROCEDURE:  Irrigation and debridement of right thumb wound.  SURGEON:  Betha Loa, MD  ASSISTANT:  None.  ANESTHESIA:  General.  IV FLUIDS:  Per Anesthesia flow sheet.  ESTIMATED BLOOD LOSS:  Minimal.  COMPLICATIONS:  None.  SPECIMENS:  None.  TOURNIQUET TIME:  10 minutes.  DISPOSITION:  Stable to PACU.  INDICATIONS:  Krista Arroyo is a 74 year old female who had a paronychia while on a cruise.  This was incised and drained by the cruise physician.  She followed up in the emergency department on her return home, at which time, she had continued infection.  Again, a repeat incision and drainage was performed.  She has had continued purulent drainage from the wound.  She returns today for repeat irrigation and debridement.  Risks, benefits, and alternatives of surgery were discussed including the risk of blood loss; infection; damage to nerves, vessels, tendons, ligaments, bone; failure of surgery; need for additional surgery; complications with wound healing; continued pain; continued infection; need for repeat irrigation and debridement. She voiced understanding of these risks and elected to proceed.  DESCRIPTION OF PROCEDURE:  After being identified preoperatively by myself, the patient and I agreed upon the procedure and site of the procedure.  Surgical site was marked.  The risks, benefits, and alternatives of surgery were reviewed and she wished to proceed. Surgical consent has been signed.  Antibiotics were held for  cultures. She was transported to the operating room and placed on the operating room table in supine position with the right upper extremity on arm board.  General anesthesia was induced by anesthesiologist.  Right upper extremity was prepped and draped in normal sterile orthopedic fashion. Surgical pause was performed between surgeons, Anesthesia, and operating staff, and all were in agreement as to the patient, procedure, and site of the procedure.  Tourniquet at the proximal aspect of the extremity was inflated to 250 mmHg after gravity exsanguination of the digits and Esmarch exsanguination of the hand and forearm.  The area on the volar aspect of the thumb was probed.  The skin is very thin.  There was a wound underneath the epidermis.  The epidermis was debrided sharply with the scissors.  The wound coursed to communicate with the previous abscess cavity.  There was devitalized fat.  This was removed.  Bipolar electrocautery was used to obtain hemostasis.  All remaining purulence was removed.  The abscess cavity was well defined.  It was copiously irrigated with sterile saline and packed with quarter-inch iodoform gauze.  The wounds were then dressed with sterile 4x4s and wrapped with a Coban dressing lightly.  A digital block was performed with 7 mL of 0.25% plain Marcaine to aid in postoperative  analgesia.  The tourniquet was deflated at 10 minutes.  The fingertips were pink with brisk capillary refill after deflation of the tourniquet.  Operative drapes were broken down.  The patient was awoken from anesthesia safely.  She was transferred back to stretcher and taken to the PACU in stable condition.  I will see her back in 3 days for postoperative followup.  I will give her Norco 5/325, 1 to 2 p.o. q.6 hours p.r.n. pain, dispensed #30.  She will continue on Augmentin 875 mg p.o. b.i.d.     Betha Loa, MD   ______________________________ Betha Loa, MD    KK/MEDQ  D:   09/11/2014  T:  09/12/2014  Job:  [id number]

## 2014-09-14 ENCOUNTER — Encounter (HOSPITAL_BASED_OUTPATIENT_CLINIC_OR_DEPARTMENT_OTHER): Payer: Self-pay | Admitting: Orthopedic Surgery

## 2014-09-14 LAB — WOUND CULTURE
CULTURE: NO GROWTH
Gram Stain: NONE SEEN

## 2014-09-16 LAB — ANAEROBIC CULTURE: Gram Stain: NONE SEEN

## 2014-10-08 LAB — FUNGUS CULTURE W SMEAR: FUNGAL SMEAR: NONE SEEN

## 2014-10-25 LAB — AFB CULTURE WITH SMEAR (NOT AT ARMC): Acid Fast Smear: NONE SEEN

## 2015-05-14 ENCOUNTER — Other Ambulatory Visit: Payer: Self-pay | Admitting: Internal Medicine

## 2015-05-14 DIAGNOSIS — M79605 Pain in left leg: Principal | ICD-10-CM

## 2015-05-14 DIAGNOSIS — IMO0002 Reserved for concepts with insufficient information to code with codable children: Secondary | ICD-10-CM

## 2015-05-14 DIAGNOSIS — M79604 Pain in right leg: Secondary | ICD-10-CM

## 2015-05-14 DIAGNOSIS — E1165 Type 2 diabetes mellitus with hyperglycemia: Secondary | ICD-10-CM

## 2015-05-14 DIAGNOSIS — E1169 Type 2 diabetes mellitus with other specified complication: Secondary | ICD-10-CM

## 2015-05-20 ENCOUNTER — Ambulatory Visit
Admission: RE | Admit: 2015-05-20 | Discharge: 2015-05-20 | Disposition: A | Discharge status: Home / Self Care | Payer: Medicare Other | Source: Ambulatory Visit | Attending: Internal Medicine | Admitting: Internal Medicine

## 2015-05-20 ENCOUNTER — Other Ambulatory Visit: Payer: Self-pay | Admitting: Internal Medicine

## 2015-05-20 DIAGNOSIS — IMO0002 Reserved for concepts with insufficient information to code with codable children: Secondary | ICD-10-CM

## 2015-05-20 DIAGNOSIS — M79604 Pain in right leg: Secondary | ICD-10-CM

## 2015-05-20 DIAGNOSIS — E1165 Type 2 diabetes mellitus with hyperglycemia: Secondary | ICD-10-CM

## 2015-05-20 DIAGNOSIS — M79605 Pain in left leg: Principal | ICD-10-CM

## 2015-05-20 DIAGNOSIS — E1169 Type 2 diabetes mellitus with other specified complication: Secondary | ICD-10-CM

## 2015-09-29 ENCOUNTER — Telehealth: Payer: Self-pay | Admitting: Cardiovascular Disease

## 2015-09-29 NOTE — Telephone Encounter (Signed)
Received records from Dr Ralene Okoy Moreira for appointment with Dr Allyson SabalBerry on 10/22/15.  [facility] Dr Hazle CocaBerry's schedule on 10/22/15. lp

## 2015-10-22 ENCOUNTER — Ambulatory Visit (INDEPENDENT_AMBULATORY_CARE_PROVIDER_SITE_OTHER): Payer: Medicare Other | Admitting: Cardiovascular Disease

## 2015-10-22 ENCOUNTER — Encounter: Payer: Self-pay | Admitting: Cardiovascular Disease

## 2015-10-22 VITALS — BP 110/60 | HR 76 | Ht 63.0 in | Wt 133.0 lb

## 2015-10-22 DIAGNOSIS — R0989 Other specified symptoms and signs involving the circulatory and respiratory systems: Secondary | ICD-10-CM | POA: Diagnosis not present

## 2015-10-22 DIAGNOSIS — I10 Essential (primary) hypertension: Secondary | ICD-10-CM | POA: Diagnosis not present

## 2015-10-22 DIAGNOSIS — E785 Hyperlipidemia, unspecified: Secondary | ICD-10-CM

## 2015-10-22 DIAGNOSIS — I739 Peripheral vascular disease, unspecified: Secondary | ICD-10-CM | POA: Diagnosis not present

## 2015-10-22 NOTE — Assessment & Plan Note (Signed)
History of hyperlipidemia on Repatha  followed by her PCP

## 2015-10-22 NOTE — Patient Instructions (Signed)
Medication Instructions:  Your physician recommends that you continue on your current medications as directed. Please refer to the Current Medication list given to you today.   Testing/Procedures: Your physician has requested that you have a lower extremity arterial doppler- During this test, ultrasound is used to evaluate arterial blood flow in the legs. Allow approximately one hour for this exam.   Your physician has requested that you have a carotid duplex. This test is an ultrasound of the carotid arteries in your neck. It looks at blood flow through these arteries that supply the brain with blood. Allow one hour for this exam. There are no restrictions or special instructions.    Follow-Up: Your physician recommends that you schedule a follow-up appointment in: 1 MONTH WITH DR BERRY.  If you need a refill on your cardiac medications before your next appointment, please call your pharmacy.

## 2015-10-22 NOTE — Progress Notes (Signed)
10/22/2015 Krista Arroyo   Jul 14, 1940  [id number]  Primary Physician Ralene Ok, MD Primary Cardiologist: Runell Gess MD Roseanne Reno  HPI:  Krista Arroyo is a very pleasant 75 year old thin-appearing widowed Caucasian female mother of 2 living children, grandmother to 5 grandchildren is accompanied by her granddaughter [name]. She was referred by Dr. Ludwig Clarks for peripheral vascular evaluation because of lifestyle limiting claudication. Her cardiovascular risk factor profile is notable for 50-pack-years of tobacco abuse continued to smoke a pack a day. She has history of hypertension, hyperlipidemia and diabetes. Her brother did die of a myocardial infarction in his early 87s. She has never had a heart attack or stroke and denies chest pain or shortness of breath. She does have lifestyle limiting claudication left greater than right. Dopplers performed 2/17 revealed a right ABI 0.7 and a left of  . 36.   Current Outpatient Prescriptions  Medication Sig Dispense Refill  . amLODipine (NORVASC) 10 MG tablet Take 10 mg by mouth daily.  5  . BYSTOLIC 10 MG tablet Take 10 mg by mouth daily.  4  . donepezil (ARICEPT) 5 MG tablet Take 5 mg by mouth at bedtime.    . DULoxetine (CYMBALTA) 30 MG capsule Take 30 mg by mouth daily.  5  . furosemide (LASIX) 10 MG/ML solution Take by mouth daily.    . insulin NPH Human (HUMULIN N,NOVOLIN N) 100 UNIT/ML injection Inject 35 Units into the skin daily.    Marland Kitchen LEVEMIR FLEXTOUCH 100 UNIT/ML Pen Inject 35 Units into the skin daily.     Marland Kitchen levothyroxine (SYNTHROID, LEVOTHROID) 50 MCG tablet Take 50 mcg by mouth daily.  2  . linagliptin (TRADJENTA) 5 MG TABS tablet Take 5 mg by mouth daily.    Marland Kitchen LYRICA 50 MG capsule Take 50 mg by mouth at bedtime.  1  . metFORMIN (GLUCOPHAGE) 500 MG tablet Take 500 mg by mouth 1 day or 1 dose.    . Methylfol-Methylcob-Acetylcyst (CEREFOLIN NAC PO) Take 1 tablet by mouth daily.    Marland Kitchen NOVOFINE 32G X 6 MM  MISC USE AS DIRECTED TO TEST 3 TIMES DAILY  2  . REPATHA SURECLICK 140 MG/ML SOAJ Inject 140 mg into the skin every 28 (twenty-eight) days.    Marland Kitchen spironolactone (ALDACTONE) 50 MG tablet Take 50 mg by mouth daily.  2  . traMADol (ULTRAM) 50 MG tablet Take 50 mg by mouth every 6 (six) hours as needed.     No current facility-administered medications for this visit.    Allergies  Allergen Reactions  . Influenza Vaccines     Social History   Social History  . Marital Status: Widowed    Spouse Name: N/A  . Number of Children: N/A  . Years of Education: N/A   Occupational History  . Not on file.   Social History Main Topics  . Smoking status: Current Every Day Smoker -- 0.25 packs/day    Types: Cigarettes  . Smokeless tobacco: Not on file  . Alcohol Use: No  . Drug Use: No  . Sexual Activity: Not on file   Other Topics Concern  . Not on file   Social History Narrative     Review of Systems: General: negative for chills, fever, night sweats or weight changes.  Cardiovascular: negative for chest pain, dyspnea on exertion, edema, orthopnea, palpitations, paroxysmal nocturnal dyspnea or shortness of breath Dermatological: negative for rash Respiratory: negative for cough or wheezing Urologic: negative for hematuria Abdominal: negative  for nausea, vomiting, diarrhea, bright red blood per rectum, melena, or hematemesis Neurologic: negative for visual changes, syncope, or dizziness All other systems reviewed and are otherwise negative except as noted above.    Blood pressure 110/60, pulse 76, height 5\' 3"  (1.6 m), weight 133 lb (60.328 kg).  General appearance: alert and no distress Neck: no adenopathy, no JVD, supple, symmetrical, trachea midline, thyroid not enlarged, symmetric, no tenderness/mass/nodules and Soft right carotid bruit Lungs: clear to auscultation bilaterally Heart: regular rate and rhythm, S1, S2 normal, no murmur, click, rub or gallop Extremities:  extremities normal, atraumatic, no cyanosis or edema  EKG normal sinus rhythm at 76 without ST or T-wave changes. I personally reviewed this EKG  ASSESSMENT AND PLAN:   Claudication [facility](HCC) Patient was referred for lifestyle limiting claudication. She barely had Dopplers February 2017 revealing a right ABI 0.87 and a left of 0.36. There is no evidence of critical limb ischemia. I'm going to repeat her lower extremity artery Doppler studies. We have discussed the possibility of proceeding with angiography which she will consider and discuss at her next office visit.  Essential hypertension She has hypertension blood pressure is 110/60. She is on Bystolic , amlodipine  and Aldactone  Hyperlipidemia History of hyperlipidemia on Repatha  followed by her PCP      Runell GessJonathan J. Chavy Avera MD [facility] LLCFACP,FACC,FAHA, [facility] CenterFSCAI 10/22/2015 3:09 PM

## 2015-10-22 NOTE — Assessment & Plan Note (Signed)
She has hypertension blood pressure is 110/60. She is on Bystolic , amlodipine  and Aldactone

## 2015-10-22 NOTE — Assessment & Plan Note (Signed)
Patient was referred for lifestyle limiting claudication. She barely had Dopplers February 2017 revealing a right ABI 0.87 and a left of 0.36. There is no evidence of critical limb ischemia. I'm going to repeat her lower extremity artery Doppler studies. We have discussed the possibility of proceeding with angiography which she will consider and discuss at her next office visit.

## 2015-10-25 ENCOUNTER — Other Ambulatory Visit: Payer: Self-pay | Admitting: Cardiovascular Disease

## 2015-10-25 DIAGNOSIS — I739 Peripheral vascular disease, unspecified: Secondary | ICD-10-CM

## 2015-11-02 ENCOUNTER — Ambulatory Visit (HOSPITAL_COMMUNITY)
Admission: RE | Admit: 2015-11-02 | Discharge: 2015-11-02 | Disposition: A | Discharge status: Home / Self Care | Payer: Medicare Other | Source: Ambulatory Visit | Attending: Cardiology | Admitting: Cardiology

## 2015-11-02 DIAGNOSIS — E785 Hyperlipidemia, unspecified: Secondary | ICD-10-CM | POA: Diagnosis not present

## 2015-11-02 DIAGNOSIS — E039 Hypothyroidism, unspecified: Secondary | ICD-10-CM | POA: Insufficient documentation

## 2015-11-02 DIAGNOSIS — F419 Anxiety disorder, unspecified: Secondary | ICD-10-CM | POA: Insufficient documentation

## 2015-11-02 DIAGNOSIS — I739 Peripheral vascular disease, unspecified: Secondary | ICD-10-CM

## 2015-11-02 DIAGNOSIS — R0989 Other specified symptoms and signs involving the circulatory and respiratory systems: Secondary | ICD-10-CM

## 2015-11-02 DIAGNOSIS — I119 Hypertensive heart disease without heart failure: Secondary | ICD-10-CM | POA: Insufficient documentation

## 2015-11-02 DIAGNOSIS — R938 Abnormal findings on diagnostic imaging of other specified body structures: Secondary | ICD-10-CM | POA: Insufficient documentation

## 2015-11-02 DIAGNOSIS — I10 Essential (primary) hypertension: Secondary | ICD-10-CM | POA: Insufficient documentation

## 2015-11-02 DIAGNOSIS — I70201 Unspecified atherosclerosis of native arteries of extremities, right leg: Secondary | ICD-10-CM | POA: Insufficient documentation

## 2015-11-02 DIAGNOSIS — E119 Type 2 diabetes mellitus without complications: Secondary | ICD-10-CM | POA: Insufficient documentation

## 2015-11-02 DIAGNOSIS — I6523 Occlusion and stenosis of bilateral carotid arteries: Secondary | ICD-10-CM

## 2015-11-04 ENCOUNTER — Telehealth: Payer: Self-pay | Admitting: *Deleted

## 2015-11-04 DIAGNOSIS — I6529 Occlusion and stenosis of unspecified carotid artery: Secondary | ICD-10-CM

## 2015-11-04 NOTE — Telephone Encounter (Signed)
-----   Message from Runell Gess, MD sent at 11/03/2015 12:09 PM EDT ----- Moderate bilateral ICA stenosis. Repeat in 12 months

## 2015-11-24 ENCOUNTER — Other Ambulatory Visit: Payer: Self-pay | Admitting: *Deleted

## 2015-11-24 ENCOUNTER — Encounter: Payer: Self-pay | Admitting: Cardiovascular Disease

## 2015-11-24 ENCOUNTER — Ambulatory Visit (INDEPENDENT_AMBULATORY_CARE_PROVIDER_SITE_OTHER): Payer: Medicare Other | Admitting: Cardiovascular Disease

## 2015-11-24 VITALS — BP 136/69 | HR 72 | Ht 64.0 in | Wt 131.0 lb

## 2015-11-24 DIAGNOSIS — I739 Peripheral vascular disease, unspecified: Secondary | ICD-10-CM

## 2015-11-24 DIAGNOSIS — D689 Coagulation defect, unspecified: Secondary | ICD-10-CM | POA: Diagnosis not present

## 2015-11-24 DIAGNOSIS — Z01818 Encounter for other preprocedural examination: Secondary | ICD-10-CM | POA: Diagnosis not present

## 2015-11-24 DIAGNOSIS — I6529 Occlusion and stenosis of unspecified carotid artery: Secondary | ICD-10-CM

## 2015-11-24 DIAGNOSIS — Z79899 Other long term (current) drug therapy: Secondary | ICD-10-CM

## 2015-11-24 DIAGNOSIS — Z0000 Encounter for general adult medical examination without abnormal findings: Secondary | ICD-10-CM

## 2015-11-24 NOTE — Assessment & Plan Note (Signed)
Miss Krista Arroyo returns today for follow-up of her Doppler studies. 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 She does have lifestyle including claudication. We've agreed to proceed with angiography and potential endovascular therapy sometime within the next 4-6 weeks.

## 2015-11-24 NOTE — Progress Notes (Signed)
11/24/2015 Krista Arroyo   11/23/1940  [id number]  Primary Physician Krista Arroyo,ROY, MD Primary Cardiologist: Krista GessJonathan J Arturo Freundlich MD Krista RenoFACP, FACC, FAHA, FSCAI  HPI:  Mrs. Krista Arroyo is a very pleasant 75 year old thin-appearing widowed Caucasian female mother of 2 living children, grandmother to 5 grandchildren is accompanied by her granddaughter Krista Arroyo. She was referred by Dr. Ludwig Arroyo for peripheral vascular evaluation because of lifestyle limiting claudication. I last saw her in the office 10/22/15. Her cardiovascular risk factor profile is notable for 50-pack-years of tobacco abuse continued to smoke a pack a day. She has history of hypertension, hyperlipidemia and diabetes. Her brother did die of a myocardial infarction in his early 8160s. She has never had a heart attack or stroke and denies chest pain or shortness of breath. She does have lifestyle limiting claudication left greater than right. Dopplers performed 2/17 revealed a right ABI 0.7 and a left of  . 36. Dopplers performed 11/02/15 revealed a right ABI 1.1 with a moderately elevated-signal in the right common iliac artery and a left ABI 0.69 with what appears to be an occluded left common iliac artery.   Current Outpatient Prescriptions  Medication Sig Dispense Refill  . amLODipine (NORVASC) 10 MG tablet Take 10 mg by mouth daily.  5  . BYSTOLIC 10 MG tablet Take 10 mg by mouth daily.  4  . donepezil (ARICEPT) 5 MG tablet Take 5 mg by mouth at bedtime.    . DULoxetine (CYMBALTA) 30 MG capsule Take 30 mg by mouth daily.  5  . furosemide (LASIX) 10 MG/ML solution Take by mouth daily.    . insulin NPH Human (HUMULIN N,NOVOLIN N) 100 UNIT/ML injection Inject 35 Units into the skin daily.    Marland Kitchen. LEVEMIR FLEXTOUCH 100 UNIT/ML Pen Inject 35 Units into the skin daily.     Marland Kitchen. levothyroxine (SYNTHROID, LEVOTHROID) 50 MCG tablet Take 50 mcg by mouth daily.  2  . linagliptin (TRADJENTA) 5 MG TABS tablet Take 5 mg by mouth daily.    Marland Kitchen. LYRICA 50  MG capsule Take 50 mg by mouth at bedtime.  1  . metFORMIN (GLUCOPHAGE) 500 MG tablet Take 500 mg by mouth 1 day or 1 dose.    . Methylfol-Methylcob-Acetylcyst (CEREFOLIN NAC PO) Take 1 tablet by mouth daily.    Marland Kitchen. NOVOFINE 32G X 6 MM MISC USE AS DIRECTED TO TEST 3 TIMES DAILY  2  . REPATHA SURECLICK 140 MG/ML SOAJ Inject 140 mg into the skin every 28 (twenty-eight) days.    Marland Kitchen. spironolactone (ALDACTONE) 50 MG tablet Take 50 mg by mouth daily.  2  . traMADol (ULTRAM) 50 MG tablet Take 50 mg by mouth every 6 (six) hours as needed.     No current facility-administered medications for this visit.     Allergies  Allergen Reactions  . Influenza Vaccines     Social History   Social History  . Marital status: Widowed    Spouse name: N/A  . Number of children: N/A  . Years of education: N/A   Occupational History  . Not on file.   Social History Main Topics  . Smoking status: Current Every Day Smoker    Packs/day: 0.25    Types: Cigarettes  . Smokeless tobacco: Never Used  . Alcohol use No  . Drug use: No  . Sexual activity: Not on file   Other Topics Concern  . Not on file   Social History Narrative  . No narrative on file  Review of Systems: General: negative for chills, fever, night sweats or weight changes.  Cardiovascular: negative for chest pain, dyspnea on exertion, edema, orthopnea, palpitations, paroxysmal nocturnal dyspnea or shortness of breath Dermatological: negative for rash Respiratory: negative for cough or wheezing Urologic: negative for hematuria Abdominal: negative for nausea, vomiting, diarrhea, bright red blood per rectum, melena, or hematemesis Neurologic: negative for visual changes, syncope, or dizziness All other systems reviewed and are otherwise negative except as noted above.    Blood pressure 136/69, pulse 72, height 5\' 4"  (1.626 m), weight 131 lb (59.4 kg).  General appearance: alert and no distress Neck: no adenopathy, no carotid bruit,  no JVD, supple, symmetrical, trachea midline and thyroid not enlarged, symmetric, no tenderness/mass/nodules Lungs: clear to auscultation bilaterally Heart: regular rate and rhythm, S1, S2 normal, no murmur, click, rub or gallop Extremities: extremities normal, atraumatic, no cyanosis or edema  EKG not performed today  ASSESSMENT AND PLAN:   Claudication [facility](H[facility]) Miss Krista Arroyo returns today for follow-up of her Doppler studies. 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 She does have lifestyle including claudication. We've agreed to proceed with angiography and potential endovascular therapy sometime within the next 4-6 weeks.      Krista GessJonathan J. Krista Rieves MD FACP,FACC,FAHA, [facility] 11/24/2015 2:56 PM

## 2015-11-24 NOTE — Patient Instructions (Signed)
Medication Instructions:  Your physician recommends that you continue on your current medications as directed. Please refer to the Current Medication list given to you today.   Testing/Procedures: Dr. Berry has ordered a peripheral angiogram to be done at [facility].  This procedure is going to look at the bloodflow in your lower extremities.  If Dr. Berry is able to open up the arteries, you will have to spend one night in the hospital.  If he is not able to open the arteries, you will be able to go home that same day.    After the procedure, you will not be allowed to drive for 3 days or push, pull, or lift anything greater than 10 lbs for one week.    You will be required to have the following tests prior to the procedure:  1. Blood work-the blood work can be done no more than 14 days prior to the procedure.  It can be done at any [facility] lab.  There is one downstairs on the first floor of this building and one in the [facility] building ([address], [address])  2. Chest Xray-the chest xray order has already been placed at the [facility].     *REPS  NONE PER DR BERRY  Puncture site RIGHT GROIN   If you need a refill on your cardiac medications before your next appointment, please call your pharmacy.   

## 2016-01-11 ENCOUNTER — Ambulatory Visit
Admission: RE | Admit: 2016-01-11 | Discharge: 2016-01-11 | Disposition: A | Discharge status: Home / Self Care | Payer: Medicare Other | Source: Ambulatory Visit | Attending: Cardiovascular Disease | Admitting: Cardiovascular Disease

## 2016-01-11 DIAGNOSIS — Z0000 Encounter for general adult medical examination without abnormal findings: Secondary | ICD-10-CM

## 2016-01-11 DIAGNOSIS — D689 Coagulation defect, unspecified: Secondary | ICD-10-CM

## 2016-01-11 DIAGNOSIS — Z01818 Encounter for other preprocedural examination: Secondary | ICD-10-CM

## 2016-01-11 DIAGNOSIS — I739 Peripheral vascular disease, unspecified: Secondary | ICD-10-CM

## 2016-01-11 DIAGNOSIS — Z79899 Other long term (current) drug therapy: Secondary | ICD-10-CM

## 2016-01-11 LAB — CBC WITH DIFFERENTIAL/PLATELET
BASOS ABS: 0 {cells}/uL (ref 0–200)
Basophils Relative: 0 %
EOS PCT: 1 %
Eosinophils Absolute: 86 cells/uL (ref 15–500)
HCT: 46.7 % — ABNORMAL HIGH (ref 35.0–45.0)
Hemoglobin: 15.6 g/dL — ABNORMAL HIGH (ref 11.7–15.5)
LYMPHS PCT: 29 %
Lymphs Abs: 2494 cells/uL (ref 850–3900)
MCH: 31.5 pg (ref 27.0–33.0)
MCHC: 33.4 g/dL (ref 32.0–36.0)
MCV: 94.3 fL (ref 80.0–100.0)
MONOS PCT: 10 %
MPV: 12.1 fL (ref 7.5–12.5)
Monocytes Absolute: 860 cells/uL (ref 200–950)
NEUTROS ABS: 5160 {cells}/uL (ref 1500–7800)
Neutrophils Relative %: 60 %
Platelets: 85 10*3/uL — ABNORMAL LOW (ref 140–400)
RBC: 4.95 MIL/uL (ref 3.80–5.10)
RDW: 13.3 % (ref 11.0–15.0)
WBC: 8.6 10*3/uL (ref 3.8–10.8)

## 2016-01-11 LAB — TSH: TSH: 1.59 m[IU]/L

## 2016-01-12 LAB — PROTIME-INR
INR: 1
Prothrombin Time: 10.9 s (ref 9.0–11.5)

## 2016-01-12 LAB — BASIC METABOLIC PANEL
BUN: 41 mg/dL — AB (ref 7–25)
CALCIUM: 10.2 mg/dL (ref 8.6–10.4)
CO2: 22 mmol/L (ref 20–31)
CREATININE: 1.66 mg/dL — AB (ref 0.60–0.93)
Chloride: 97 mmol/L — ABNORMAL LOW (ref 98–110)
Glucose, Bld: 368 mg/dL — ABNORMAL HIGH (ref 65–99)
Potassium: 5.1 mmol/L (ref 3.5–5.3)
Sodium: 132 mmol/L — ABNORMAL LOW (ref 135–146)

## 2016-01-12 LAB — APTT: APTT: 27 s (ref 22–34)

## 2016-01-13 ENCOUNTER — Telehealth: Payer: Self-pay | Admitting: *Deleted

## 2016-01-13 DIAGNOSIS — Z01818 Encounter for other preprocedural examination: Secondary | ICD-10-CM

## 2016-01-13 DIAGNOSIS — R7989 Other specified abnormal findings of blood chemistry: Secondary | ICD-10-CM

## 2016-01-13 NOTE — Telephone Encounter (Signed)
Called patient. She wrote down and verbalized understanding to stop her spironolactone and furosemide on 10/16 and to get repeat lab work on 01/20/16. Advised patient that depending on the result of lab work would determine if she would need to be pre admitted for hydration.

## 2016-01-13 NOTE — Telephone Encounter (Signed)
-----   Message from Runell GessJonathan J Berry, MD sent at 01/12/2016  1:32 PM EDT ----- Elevated serum creatinine. We will hold her diuretics the Monday prior to her procedure, check a female Thursday prior to her procedure to determine whether or not she needs early admission for hydration.

## 2016-01-18 ENCOUNTER — Telehealth: Payer: Self-pay | Admitting: Cardiovascular Disease

## 2016-01-18 NOTE — Telephone Encounter (Signed)
Received a call from patient PCP office regarding labs needing to be done prior to upcoming procedure. Advised they were to be done Thursday. Nurse discussed with Dr Ludwig ClarksMoreira and he needs for patient care so he will go ahead and get BMET today.  Will forward to Darden DatesJennifer C RN with Dr Allyson SabalBerry so she will be looking for results

## 2016-01-21 LAB — BASIC METABOLIC PANEL
BUN: 30 mg/dL — AB (ref 7–25)
CHLORIDE: 102 mmol/L (ref 98–110)
CO2: 19 mmol/L — ABNORMAL LOW (ref 20–31)
Calcium: 9.8 mg/dL (ref 8.6–10.4)
Creat: 1.14 mg/dL — ABNORMAL HIGH (ref 0.60–0.93)
GLUCOSE: 257 mg/dL — AB (ref 65–99)
POTASSIUM: 4.6 mmol/L (ref 3.5–5.3)
Sodium: 135 mmol/L (ref 135–146)

## 2016-01-23 NOTE — H&P (Signed)
ADMISSION NOTE 01/24/16  HPI:  Mrs. Krista Arroyo is a very pleasant 75 year old thin-appearing widowed Caucasian female mother of 2 living children, grandmother to 5 grandchildren is accompanied by her granddaughter Krista Arroyo. She was referred by Dr. Ludwig ClarksMoreira for peripheral vascular evaluation because of lifestyle limiting claudication. I last saw her in the office 10/22/15. Her cardiovascular risk factor profile is notable for 50-pack-years of tobacco abuse continued to smoke a pack a day. She has history of hypertension, hyperlipidemia and diabetes. Her brother did die of a myocardial infarction in his early 4560s. She has never had a heart attack or stroke and denies chest pain or shortness of breath. She does have lifestyle limiting claudication left greater than right. Dopplers performed 2/17 revealed a right ABI 0.7 and a left of . 36. Dopplers performed 11/02/15 revealed a right ABI 1.1 with a moderately elevated-signal in the right common iliac artery and a left ABI 0.69 with what appears to be an occluded left common iliac artery.         Current Outpatient Prescriptions  Medication Sig Dispense Refill  . amLODipine (NORVASC) 10 MG tablet Take 10 mg by mouth daily.  5  . BYSTOLIC 10 MG tablet Take 10 mg by mouth daily.  4  . donepezil (ARICEPT) 5 MG tablet Take 5 mg by mouth at bedtime.    . DULoxetine (CYMBALTA) 30 MG capsule Take 30 mg by mouth daily.  5  . furosemide (LASIX) 10 MG/ML solution Take by mouth daily.    . insulin NPH Human (HUMULIN N,NOVOLIN N) 100 UNIT/ML injection Inject 35 Units into the skin daily.    Marland Kitchen. LEVEMIR FLEXTOUCH 100 UNIT/ML Pen Inject 35 Units into the skin daily.     Marland Kitchen. levothyroxine (SYNTHROID, LEVOTHROID) 50 MCG tablet Take 50 mcg by mouth daily.  2  . linagliptin (TRADJENTA) 5 MG TABS tablet Take 5 mg by mouth daily.    Marland Kitchen. LYRICA 50 MG capsule Take 50 mg by mouth at bedtime.  1  . metFORMIN (GLUCOPHAGE) 500 MG tablet Take 500 mg by mouth 1 day or  1 dose.    . Methylfol-Methylcob-Acetylcyst (CEREFOLIN NAC PO) Take 1 tablet by mouth daily.    Marland Kitchen. NOVOFINE 32G X 6 MM MISC USE AS DIRECTED TO TEST 3 TIMES DAILY  2  . REPATHA SURECLICK 140 MG/ML SOAJ Inject 140 mg into the skin every 28 (twenty-eight) days.    Marland Kitchen. spironolactone (ALDACTONE) 50 MG tablet Take 50 mg by mouth daily.  2  . traMADol (ULTRAM) 50 MG tablet Take 50 mg by mouth every 6 (six) hours as needed.     No current facility-administered medications for this visit.         Allergies  Allergen Reactions  . Influenza Vaccines     Social History        Social History  . Marital status: Widowed    Spouse name: N/A  . Number of children: N/A  . Years of education: N/A      Occupational History  . Not on file.        Social History Main Topics  . Smoking status: Current Every Day Smoker    Packs/day: 0.25    Types: Cigarettes  . Smokeless tobacco: Never Used  . Alcohol use No  . Drug use: No  . Sexual activity: Not on file       Other Topics Concern  . Not on file      Social History Narrative  . No  narrative on file     Review of Systems: General: negative for chills, fever, night sweats or weight changes.  Cardiovascular: negative for chest pain, dyspnea on exertion, edema, orthopnea, palpitations, paroxysmal nocturnal dyspnea or shortness of breath Dermatological: negative for rash Respiratory: negative for cough or wheezing Urologic: negative for hematuria Abdominal: negative for nausea, vomiting, diarrhea, bright red blood per rectum, melena, or hematemesis Neurologic: negative for visual changes, syncope, or dizziness All other systems reviewed and are otherwise negative except as noted above.    Blood pressure 136/69, pulse 72, height 5\' 4"  (1.626 m), weight 131 lb (59.4 kg).  General appearance: alert and no distress Neck: no adenopathy, no carotid bruit, no JVD, supple, symmetrical, trachea midline and  thyroid not enlarged, symmetric, no tenderness/mass/nodules Lungs: clear to auscultation bilaterally Heart: regular rate and rhythm, S1, S2 normal, no murmur, click, rub or gallop Extremities: extremities normal, atraumatic, no cyanosis or edema  EKG not performed today  ASSESSMENT AND PLAN:   Claudication [facility]) Krista Arroyo returns today for follow-up of her Doppler studies. 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 She does have lifestyle including claudication. We've agreed to proceed with angiography and potential endovascular therapy sometime in the near future  Krista Arroyo, M.D., FACP, [facility], Krista Arroyo [facility] [address] [place], [place]  [address]  [phone] 01/23/2016 4:08 PM            Office Visit on 11/24/2015        Detailed Report

## 2016-01-24 ENCOUNTER — Encounter (HOSPITAL_COMMUNITY)
Admission: RE | Disposition: A | Discharge status: Home / Self Care | Payer: Self-pay | Source: Ambulatory Visit | Attending: Cardiovascular Disease

## 2016-01-24 ENCOUNTER — Encounter (HOSPITAL_COMMUNITY): Payer: Self-pay | Admitting: Cardiovascular Disease

## 2016-01-24 ENCOUNTER — Ambulatory Visit (HOSPITAL_COMMUNITY)
Admission: RE | Admit: 2016-01-24 | Discharge: 2016-01-25 | Disposition: A | Discharge status: Home / Self Care | Payer: Medicare Other | Source: Ambulatory Visit | Attending: Cardiovascular Disease | Admitting: Cardiovascular Disease

## 2016-01-24 DIAGNOSIS — Z794 Long term (current) use of insulin: Secondary | ICD-10-CM | POA: Insufficient documentation

## 2016-01-24 DIAGNOSIS — E039 Hypothyroidism, unspecified: Secondary | ICD-10-CM | POA: Diagnosis present

## 2016-01-24 DIAGNOSIS — I70213 Atherosclerosis of native arteries of extremities with intermittent claudication, bilateral legs: Secondary | ICD-10-CM | POA: Insufficient documentation

## 2016-01-24 DIAGNOSIS — Z8249 Family history of ischemic heart disease and other diseases of the circulatory system: Secondary | ICD-10-CM | POA: Insufficient documentation

## 2016-01-24 DIAGNOSIS — Z7982 Long term (current) use of aspirin: Secondary | ICD-10-CM | POA: Diagnosis not present

## 2016-01-24 DIAGNOSIS — I701 Atherosclerosis of renal artery: Secondary | ICD-10-CM | POA: Insufficient documentation

## 2016-01-24 DIAGNOSIS — I700 Atherosclerosis of aorta: Secondary | ICD-10-CM | POA: Diagnosis not present

## 2016-01-24 DIAGNOSIS — I10 Essential (primary) hypertension: Secondary | ICD-10-CM | POA: Diagnosis not present

## 2016-01-24 DIAGNOSIS — E785 Hyperlipidemia, unspecified: Secondary | ICD-10-CM | POA: Diagnosis not present

## 2016-01-24 DIAGNOSIS — I739 Peripheral vascular disease, unspecified: Secondary | ICD-10-CM

## 2016-01-24 DIAGNOSIS — E1151 Type 2 diabetes mellitus with diabetic peripheral angiopathy without gangrene: Secondary | ICD-10-CM | POA: Insufficient documentation

## 2016-01-24 DIAGNOSIS — I70212 Atherosclerosis of native arteries of extremities with intermittent claudication, left leg: Secondary | ICD-10-CM | POA: Diagnosis not present

## 2016-01-24 DIAGNOSIS — I480 Paroxysmal atrial fibrillation: Secondary | ICD-10-CM | POA: Diagnosis not present

## 2016-01-24 DIAGNOSIS — D696 Thrombocytopenia, unspecified: Secondary | ICD-10-CM | POA: Diagnosis not present

## 2016-01-24 DIAGNOSIS — F17210 Nicotine dependence, cigarettes, uncomplicated: Secondary | ICD-10-CM | POA: Insufficient documentation

## 2016-01-24 HISTORY — PX: PERIPHERAL VASCULAR CATHETERIZATION: SHX172C

## 2016-01-24 HISTORY — DX: Paroxysmal atrial fibrillation: I48.0

## 2016-01-24 HISTORY — DX: Type 2 diabetes mellitus without complications: E11.9

## 2016-01-24 LAB — GLUCOSE, CAPILLARY
GLUCOSE-CAPILLARY: 249 mg/dL — AB (ref 65–99)
GLUCOSE-CAPILLARY: 332 mg/dL — AB (ref 65–99)
GLUCOSE-CAPILLARY: 217 mg/dL — AB (ref 65–99)
Glucose-Capillary: 283 mg/dL — ABNORMAL HIGH (ref 65–99)

## 2016-01-24 LAB — POCT ACTIVATED CLOTTING TIME
ACTIVATED CLOTTING TIME: 169 s
Activated Clotting Time: 213 seconds
Activated Clotting Time: 235 seconds

## 2016-01-24 SURGERY — LOWER EXTREMITY ANGIOGRAPHY

## 2016-01-24 MED ORDER — HEPARIN SODIUM (PORCINE) 1000 UNIT/ML IJ SOLN
INTRAMUSCULAR | Status: DC | PRN
Start: 2016-01-24 — End: 2016-01-24
  Administered 2016-01-24: 6000 [IU] via INTRAVENOUS

## 2016-01-24 MED ORDER — AMLODIPINE BESYLATE 10 MG PO TABS
10.0000 mg | ORAL_TABLET | Freq: Every day | ORAL | Status: DC
  Administered 2016-01-24: 10 mg via ORAL
  Filled 2016-01-24: qty 1

## 2016-01-24 MED ORDER — MIDAZOLAM HCL 2 MG/2ML IJ SOLN
INTRAMUSCULAR | Status: AC
  Filled 2016-01-24: qty 2

## 2016-01-24 MED ORDER — INSULIN DETEMIR 100 UNIT/ML ~~LOC~~ SOLN
35.0000 [IU] | Freq: Every day | SUBCUTANEOUS | Status: DC
  Filled 2016-01-24: qty 0.35

## 2016-01-24 MED ORDER — ATROPINE SULFATE 1 MG/10ML IJ SOSY
PREFILLED_SYRINGE | INTRAMUSCULAR | Status: AC
  Filled 2016-01-24: qty 10

## 2016-01-24 MED ORDER — INSULIN ASPART 100 UNIT/ML ~~LOC~~ SOLN
0.0000 [IU] | Freq: Three times a day (TID) | SUBCUTANEOUS | Status: DC
  Administered 2016-01-24: 17:00:00 11 [IU] via SUBCUTANEOUS
  Administered 2016-01-24 – 2016-01-25 (×2): 5 [IU] via SUBCUTANEOUS
  Administered 2016-01-25: 15 [IU] via SUBCUTANEOUS

## 2016-01-24 MED ORDER — INSULIN NPH (HUMAN) (ISOPHANE) 100 UNIT/ML ~~LOC~~ SUSP: 35.0000 [IU] | Freq: Every day | SUBCUTANEOUS | Status: DC

## 2016-01-24 MED ORDER — FENTANYL CITRATE (PF) 100 MCG/2ML IJ SOLN
INTRAMUSCULAR | Status: AC
  Filled 2016-01-24: qty 2

## 2016-01-24 MED ORDER — SODIUM CHLORIDE 0.9 % WEIGHT BASED INFUSION
3.0000 mL/kg/h | INTRAVENOUS | Status: DC
  Administered 2016-01-24: 3 mL/kg/h via INTRAVENOUS

## 2016-01-24 MED ORDER — PREGABALIN 25 MG PO CAPS
50.0000 mg | ORAL_CAPSULE | Freq: Every day | ORAL | Status: DC
  Administered 2016-01-24: 22:00:00 50 mg via ORAL
  Filled 2016-01-24: qty 2

## 2016-01-24 MED ORDER — HEPARIN (PORCINE) IN NACL 2-0.9 UNIT/ML-% IJ SOLN
INTRAMUSCULAR | Status: DC | PRN
  Administered 2016-01-24: 1000 mL via INTRA_ARTERIAL

## 2016-01-24 MED ORDER — DONEPEZIL HCL 5 MG PO TABS
5.0000 mg | ORAL_TABLET | Freq: Every day | ORAL | Status: DC
  Administered 2016-01-24: 5 mg via ORAL
  Filled 2016-01-24: qty 1

## 2016-01-24 MED ORDER — TRAMADOL HCL 50 MG PO TABS
50.0000 mg | ORAL_TABLET | Freq: Four times a day (QID) | ORAL | Status: DC | PRN
  Administered 2016-01-24: 13:00:00 50 mg via ORAL
  Filled 2016-01-24: qty 1

## 2016-01-24 MED ORDER — ASPIRIN EC 81 MG PO TBEC
81.0000 mg | DELAYED_RELEASE_TABLET | Freq: Every day | ORAL | Status: DC
  Administered 2016-01-25: 81 mg via ORAL
  Filled 2016-01-24: qty 1

## 2016-01-24 MED ORDER — SPIRONOLACTONE 25 MG PO TABS
50.0000 mg | ORAL_TABLET | Freq: Every day | ORAL | Status: DC
  Administered 2016-01-24: 13:00:00 50 mg via ORAL
  Filled 2016-01-24: qty 2

## 2016-01-24 MED ORDER — FENTANYL CITRATE (PF) 100 MCG/2ML IJ SOLN
INTRAMUSCULAR | Status: DC | PRN
  Administered 2016-01-24: 25 ug via INTRAVENOUS

## 2016-01-24 MED ORDER — CLOPIDOGREL BISULFATE 300 MG PO TABS
ORAL_TABLET | ORAL | Status: AC
  Filled 2016-01-24: qty 1

## 2016-01-24 MED ORDER — LIDOCAINE HCL (PF) 1 % IJ SOLN
INTRAMUSCULAR | Status: DC | PRN
  Administered 2016-01-24 (×2): 25 mL

## 2016-01-24 MED ORDER — LIDOCAINE HCL (PF) 1 % IJ SOLN
INTRAMUSCULAR | Status: AC
  Filled 2016-01-24: qty 30

## 2016-01-24 MED ORDER — LINAGLIPTIN 5 MG PO TABS
5.0000 mg | ORAL_TABLET | Freq: Every day | ORAL | Status: DC
  Administered 2016-01-24 – 2016-01-25 (×2): 5 mg via ORAL
  Filled 2016-01-24 (×2): qty 1

## 2016-01-24 MED ORDER — MIDAZOLAM HCL 2 MG/2ML IJ SOLN
INTRAMUSCULAR | Status: DC | PRN
  Administered 2016-01-24: 1 mg via INTRAVENOUS

## 2016-01-24 MED ORDER — LEVOTHYROXINE SODIUM 50 MCG PO TABS
50.0000 ug | ORAL_TABLET | Freq: Every day | ORAL | Status: DC
  Administered 2016-01-24 – 2016-01-25 (×2): 50 ug via ORAL
  Filled 2016-01-24 (×2): qty 1

## 2016-01-24 MED ORDER — CLOPIDOGREL BISULFATE 75 MG PO TABS
75.0000 mg | ORAL_TABLET | Freq: Every day | ORAL | Status: DC
  Administered 2016-01-25: 75 mg via ORAL
  Filled 2016-01-24: qty 1

## 2016-01-24 MED ORDER — ACETAMINOPHEN 325 MG PO TABS: 650.0000 mg | ORAL_TABLET | ORAL | Status: DC | PRN

## 2016-01-24 MED ORDER — ASPIRIN 81 MG PO CHEW
81.0000 mg | CHEWABLE_TABLET | ORAL | Status: AC
  Administered 2016-01-24: 81 mg via ORAL

## 2016-01-24 MED ORDER — SODIUM CHLORIDE 0.9 % WEIGHT BASED INFUSION: 1.0000 mL/kg/h | INTRAVENOUS | Status: DC

## 2016-01-24 MED ORDER — IODIXANOL 320 MG/ML IV SOLN
INTRAVENOUS | Status: DC | PRN
  Administered 2016-01-24: 150 mL via INTRA_ARTERIAL

## 2016-01-24 MED ORDER — CLOPIDOGREL BISULFATE 300 MG PO TABS
ORAL_TABLET | ORAL | Status: DC | PRN
  Administered 2016-01-24: 300 mg via ORAL

## 2016-01-24 MED ORDER — EVOLOCUMAB 140 MG/ML ~~LOC~~ SOAJ
140.0000 mg | SUBCUTANEOUS | Status: DC
Start: 2016-01-24 — End: 2016-01-24

## 2016-01-24 MED ORDER — SODIUM CHLORIDE 0.9 % IV SOLN: INTRAVENOUS | Status: AC

## 2016-01-24 MED ORDER — MORPHINE SULFATE (PF) 2 MG/ML IV SOLN: 2.0000 mg | INTRAVENOUS | Status: DC | PRN

## 2016-01-24 MED ORDER — HEPARIN (PORCINE) IN NACL 2-0.9 UNIT/ML-% IJ SOLN
INTRAMUSCULAR | Status: AC
  Filled 2016-01-24: qty 1000

## 2016-01-24 MED ORDER — ONDANSETRON HCL 4 MG/2ML IJ SOLN: 4.0000 mg | Freq: Four times a day (QID) | INTRAMUSCULAR | Status: DC | PRN

## 2016-01-24 MED ORDER — FUROSEMIDE 40 MG PO TABS
40.0000 mg | ORAL_TABLET | Freq: Every day | ORAL | Status: DC
  Administered 2016-01-24: 13:00:00 40 mg via ORAL
  Filled 2016-01-24: qty 1

## 2016-01-24 MED ORDER — NEBIVOLOL HCL 10 MG PO TABS
10.0000 mg | ORAL_TABLET | Freq: Every day | ORAL | Status: DC
  Administered 2016-01-24: 10 mg via ORAL
  Filled 2016-01-24 (×2): qty 1

## 2016-01-24 MED ORDER — HEPARIN SODIUM (PORCINE) 1000 UNIT/ML IJ SOLN
INTRAMUSCULAR | Status: AC
  Filled 2016-01-24: qty 1

## 2016-01-24 MED ORDER — ASPIRIN 81 MG PO CHEW
CHEWABLE_TABLET | ORAL | Status: AC
  Administered 2016-01-24: 81 mg
  Filled 2016-01-24: qty 1

## 2016-01-24 MED ORDER — DULOXETINE HCL 30 MG PO CPEP
30.0000 mg | ORAL_CAPSULE | Freq: Every day | ORAL | Status: DC
  Administered 2016-01-24 – 2016-01-25 (×2): 30 mg via ORAL
  Filled 2016-01-24 (×2): qty 1

## 2016-01-24 MED ORDER — HYDRALAZINE HCL 20 MG/ML IJ SOLN: 10.0000 mg | INTRAMUSCULAR | Status: DC | PRN

## 2016-01-24 MED ORDER — SODIUM CHLORIDE 0.9% FLUSH: 3.0000 mL | INTRAVENOUS | Status: DC | PRN

## 2016-01-24 SURGICAL SUPPLY — 27 items
BALLN MUSTANG 4.0X40 75 (BALLOONS) ×3
BALLN MUSTANG 4.0X40 75CM (BALLOONS) ×1
BALLN MUSTANG 6.0X40 75 (BALLOONS) ×3
BALLN MUSTANG 6.0X40 75CM (BALLOONS) ×1
BALLOON MUSTANG 4.0X40 75 (BALLOONS) IMPLANT
BALLOON MUSTANG 6.0X40 75 (BALLOONS) ×1 IMPLANT
CATH INFINITI 5 FR IM (CATHETERS) ×4 IMPLANT
CATH OMNI FLUSH 5F 65CM (CATHETERS) ×2 IMPLANT
CATH STRAIGHT 5FR 65CM (CATHETERS) ×2 IMPLANT
DEVICE CONTINUOUS FLUSH (MISCELLANEOUS) ×6 IMPLANT
GUIDEWIRE ANGLED .035X150CM (WIRE) ×3 IMPLANT
KIT ENCORE 26 ADVANTAGE (KITS) ×3 IMPLANT
KIT PV (KITS) ×4 IMPLANT
SHEATH BRITE TIP 6FR 35CM (SHEATH) ×2 IMPLANT
SHEATH BRITE TIP 7FR 35CM (SHEATH) IMPLANT
SHEATH PINNACLE 5F 10CM (SHEATH) ×4 IMPLANT
SHEATH PINNACLE 6F 10CM (SHEATH) ×2 IMPLANT
STENT ABSOLUTE PRO 8X60X135 (Permanent Stent) ×4 IMPLANT
STOPCOCK MORSE 400PSI 3WAY (MISCELLANEOUS) ×3 IMPLANT
SYR MEDRAD MARK V 150ML (SYRINGE) ×4 IMPLANT
SYRINGE MEDRAD AVANTA MACH 7 (SYRINGE) ×2 IMPLANT
TAPE RADIOPAQUE TURBO (MISCELLANEOUS) ×3 IMPLANT
TRANSDUCER W/STOPCOCK (MISCELLANEOUS) ×4 IMPLANT
TRAY PV CATH (CUSTOM PROCEDURE TRAY) ×4 IMPLANT
TUBING CIL FLEX 10 FLL-RA (TUBING) ×3 IMPLANT
WIRE HITORQ VERSACORE ST 145CM (WIRE) ×4 IMPLANT
WIRE VERSACORE LOC 115CM (WIRE) ×2 IMPLANT

## 2016-01-24 NOTE — Progress Notes (Signed)
Site area: left groin  Site Prior to Removal:  Level 0  Pressure Applied For 20 MINUTES    Minutes Beginning at 1245  Manual:   Yes.    Patient Status During Pull:  AAO X3  Post Pull Groin Site:  Level 1  Post Pull Instructions Given:  Yes.    Post Pull Pulses Present:  Yes.    Dressing Applied:  Yes.    Comments:  Tolerated procedure well

## 2016-01-24 NOTE — Care Management Note (Addendum)
Case Management Note  Patient Details  Name: Renne CriglerCarol A Hemmer MRN: [id number] Date of Birth: 06/27/1940  Subjective/Objective:  [facility] will be on plavix and asa, Patient lives alone, she is indep at home. She has pcp, she has medication coverage and transportation at dc.  NCM will cont to follow for dc needs.                   Action/Plan:   Expected Discharge Date:                  Expected Discharge Plan:  Home/Self Care  In-House Referral:     Discharge planning Services  CM Consult  Post Acute Care Choice:    Choice offered to:     DME Arranged:    DME Agency:     HH Arranged:    HH Agency:     Status of Service:  In process, will continue to follow  If discussed at Long Length of Stay Meetings, dates discussed:    Additional Comments:  Leone Havenaylor, Francetta Ilg Clinton, RN 01/24/2016, 2:05 PM

## 2016-01-24 NOTE — Progress Notes (Signed)
Inpatient Diabetes Program Recommendations  AACE/ADA: New Consensus Statement on Inpatient Glycemic Control (2015)  Target Ranges:  Prepandial:   less than 140 mg/dL      Peak postprandial:   less than 180 mg/dL (1-2 hours)      Critically ill patients:  140 - 180 mg/dL   Lab Results  Component Value Date   GLUCAP 249 (H) 01/24/2016   HGBA1C (H) 08/23/2009    7.4 (NOTE)                                                                       According to the ADA Clinical Practice Recommendations for 2011, when HbA1c is used as a screening test:   >=6.5%   Diagnostic of Diabetes Mellitus           (if abnormal result  is confirmed)  5.7-6.4%   Increased risk of developing Diabetes Mellitus  References:Diagnosis and Classification of Diabetes Mellitus,Diabetes Care,2011,34(Suppl 1):S62-S69 and Standards of Medical Care in         Diabetes - 2011,Diabetes Care,2011,34  (Suppl 1):S11-S61.    Review of Glycemic Control  Diabetes history: DM 2 Outpatient Diabetes medications: U-500 35 units TID, Levemir 35 units Daily, Metformin 500 mg Daily, Tradjenta 5mg  Daily Current orders for Inpatient glycemic control: Tradjenta 5 mg Daily, Novolog Moderate TID  Inpatient Diabetes Program Recommendations:   Glucose in 200's while inpatient, Patient takes concentrated U-500 at home in addition to Levemir 35 units. Please consider ordering Levemir 35 units while inpatient, if glucose trends increase further please consider ordering Humulin U-500 35 units BID with meals.  Thanks,  Christena DeemShannon Jalexa Pifer RN, MSN, [facility] Inpatient Diabetes Coordinator Team Pager [phone] (8a-5p)

## 2016-01-24 NOTE — Progress Notes (Signed)
Site area: right groin  Site Prior to Removal:  Level 0  Pressure Applied For 20 MINUTES    Minutes Beginning at 1115  Manual:   Yes.    Patient Status During Pull:  AAO X3  Post Pull Groin Site:  Level 0  Post Pull Instructions Given:  Yes.    Post Pull Pulses Present:  Yes.    Dressing Applied:  Yes.    Comments:  Tolerated procedure well , post sheath pull instructions given

## 2016-01-24 NOTE — Progress Notes (Signed)
Patient developed some episodes of PAF. Confirmed with EKG. She has no history of PAF documented, does not see a Cardiologist. She is asymptomatic, denies chest pain, palpitations and SOB.   Discussed with Dr. Katrinka BlazingSmith.   -Will order Echo  - This patients CHA2DS2-VASc Score and unadjusted Ischemic Stroke Rate (% per year) is equal to 9.7 % stroke rate/year from a score of 6 Above score calculated as 1 point each if present [CHF, HTN, DM, Vascular=MI/PAD/Aortic Plaque, Age if 65-74, or Female], 2 points each if present [Age > 75, or Stroke/TIA/TE]  Will talk with Dr. Allyson SabalBerry tomorrow regarding anticoagulation. Patient has known chronic thrombocytopenia (Plt 80K today), and s/p left external iliac stenting today and will require ASA + Plavix. Will need to consult with Dr. Allyson SabalBerry regarding triple therapy although this is not ideal given her thrombocytopenia. Patient is hemodynamically stable.

## 2016-01-25 ENCOUNTER — Other Ambulatory Visit: Payer: Self-pay | Admitting: Physician Assistant

## 2016-01-25 ENCOUNTER — Encounter (HOSPITAL_COMMUNITY): Payer: Self-pay | Admitting: Physician Assistant

## 2016-01-25 ENCOUNTER — Ambulatory Visit (HOSPITAL_BASED_OUTPATIENT_CLINIC_OR_DEPARTMENT_OTHER): Payer: Medicare Other

## 2016-01-25 DIAGNOSIS — I70213 Atherosclerosis of native arteries of extremities with intermittent claudication, bilateral legs: Secondary | ICD-10-CM | POA: Diagnosis not present

## 2016-01-25 DIAGNOSIS — I10 Essential (primary) hypertension: Secondary | ICD-10-CM | POA: Diagnosis not present

## 2016-01-25 DIAGNOSIS — I4891 Unspecified atrial fibrillation: Secondary | ICD-10-CM

## 2016-01-25 DIAGNOSIS — I480 Paroxysmal atrial fibrillation: Secondary | ICD-10-CM | POA: Diagnosis not present

## 2016-01-25 DIAGNOSIS — I739 Peripheral vascular disease, unspecified: Secondary | ICD-10-CM

## 2016-01-25 DIAGNOSIS — D696 Thrombocytopenia, unspecified: Secondary | ICD-10-CM | POA: Diagnosis not present

## 2016-01-25 HISTORY — DX: Paroxysmal atrial fibrillation: I48.0

## 2016-01-25 LAB — ECHOCARDIOGRAM COMPLETE
Height: 64 in
Weight: 2313.95 oz

## 2016-01-25 LAB — CBC
HEMATOCRIT: 40 % (ref 36.0–46.0)
HEMOGLOBIN: 13.9 g/dL (ref 12.0–15.0)
MCH: 32 pg (ref 26.0–34.0)
MCHC: 34.8 g/dL (ref 30.0–36.0)
MCV: 92.2 fL (ref 78.0–100.0)
Platelets: 68 10*3/uL — ABNORMAL LOW (ref 150–400)
RBC: 4.34 MIL/uL (ref 3.87–5.11)
RDW: 12.7 % (ref 11.5–15.5)
WBC: 7 10*3/uL (ref 4.0–10.5)

## 2016-01-25 LAB — GLUCOSE, CAPILLARY
Glucose-Capillary: 222 mg/dL — ABNORMAL HIGH (ref 65–99)
Glucose-Capillary: 371 mg/dL — ABNORMAL HIGH (ref 65–99)

## 2016-01-25 LAB — BASIC METABOLIC PANEL
ANION GAP: 8 (ref 5–15)
BUN: 23 mg/dL — AB (ref 6–20)
CO2: 23 mmol/L (ref 22–32)
Calcium: 9.5 mg/dL (ref 8.9–10.3)
Chloride: 104 mmol/L (ref 101–111)
Creatinine, Ser: 1.14 mg/dL — ABNORMAL HIGH (ref 0.44–1.00)
GFR calc Af Amer: 53 mL/min — ABNORMAL LOW (ref >60)
GFR, EST NON AFRICAN AMERICAN: 46 mL/min — AB (ref >60)
GLUCOSE: 226 mg/dL — AB (ref 65–99)
POTASSIUM: 3.9 mmol/L (ref 3.5–5.1)
Sodium: 135 mmol/L (ref 135–145)

## 2016-01-25 LAB — TSH: TSH: 1.757 u[IU]/mL (ref 0.350–4.500)

## 2016-01-25 MED ORDER — RIVAROXABAN 15 MG PO TABS: 15.0000 mg | ORAL_TABLET | Freq: Every day | ORAL | 11 refills | Status: AC

## 2016-01-25 MED ORDER — CLOPIDOGREL BISULFATE 75 MG PO TABS: 75.0000 mg | ORAL_TABLET | Freq: Every day | ORAL | 11 refills | Status: AC

## 2016-01-25 MED ORDER — NEBIVOLOL HCL 10 MG PO TABS
20.0000 mg | ORAL_TABLET | Freq: Every day | ORAL | Status: DC
  Administered 2016-01-25: 11:00:00 20 mg via ORAL
  Filled 2016-01-25: qty 2

## 2016-01-25 MED ORDER — AMLODIPINE BESYLATE 10 MG PO TABS
10.0000 mg | ORAL_TABLET | Freq: Every day | ORAL | Status: DC
Start: 2016-01-26 — End: 2016-01-25

## 2016-01-25 MED ORDER — INSULIN DETEMIR 100 UNIT/ML ~~LOC~~ SOLN
35.0000 [IU] | Freq: Every day | SUBCUTANEOUS | Status: DC
  Administered 2016-01-25: 13:00:00 35 [IU] via SUBCUTANEOUS
  Filled 2016-01-25: qty 0.35

## 2016-01-25 MED ORDER — ASPIRIN 81 MG PO TBEC: 81.0000 mg | DELAYED_RELEASE_TABLET | Freq: Every day | ORAL | Status: DC

## 2016-01-25 MED ORDER — BYSTOLIC 10 MG PO TABS: 20.0000 mg | ORAL_TABLET | Freq: Every day | ORAL | 4 refills | Status: AC

## 2016-01-25 NOTE — Discharge Summary (Signed)
Discharge Summary    Patient ID: Krista Arroyo,  MRN: [id number], DOB/AGE: [birth year] 75 y.o.  Admit date: 01/24/2016 Discharge date: 01/25/2016  Primary Care Provider: [facility] Primary Cardiologist: Dr Allyson Sabal  Discharge Diagnoses    Principal Problem:   Claudication [facility]) Active Problems:   Thrombocytopenia (HCC)   Hypothyroidism   PAF (paroxysmal atrial fibrillation) (HCC)   Allergies Allergies  Allergen Reactions  . Influenza Vaccines Other (See Comments)    Sore arm and swollen spot to injection site    Diagnostic Studies/Procedures    PV CATH: 01/24/2016 Angiographic Data:  1: Abdominal aortogram-the abdominal aorta was fluoroscopically calcified. 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 2: Left lower extremity-there was a 90-95% segmental stenosis in the left external iliac artery just after the takeoff of the hypogastric artery. 3: Right lower extremity-there was a 50% calcified ostial right common iliac artery stenosis IMPRESSION:Mrs. 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 We will proceed with PTA and self expanding stenting. Procedure Description: The patient received 6000 units of heparin intravenously with an ACT of 226. She also received 300 mg of by mouth Plavix at the end of the case. I was able to cross the lesion with a 5 [place] end hole catheter and a 035 angled Glidewire which I then exchanged for a 0.35 Versicore wire. Following this I predilated the diseased segment with a 4 mm x 4 cm balloon and ploidy a 8 mm x 60 mm Abbott absolute Pro nitinol self expanding stent. I postdilated with a 6 mm x 4 cm balloon resulting in reduction of a long 90-95% stenosis in the left external iliac artery to less than 10% residual. There was excellent flow and no resting gradient. The bright tip sheath was exchanged over the wire for a short 6 [place] sheath. The Omni Flush was  removed over wire and both sheaths were secured in place. The patient left the lab in stable condition. Final Impression:Successful left external iliac artery PTA and self expanding stenting for high-grade segmental left external iliac artery stenosis and lifestyle limiting claudication. The patient does have known thrombocytopenia with a platelet count in the mid 80,000 range. I do not think this will be an issue regarding antiplatelet therapy. The sheath will be removed once the ACT is documented to be less than 170 and pressure held. The patient will be hydrated overnight and discharged home in the morning on dual antiplatelet therapy. She will obtain lower extremity arterial Doppler studies in our [employer] office next week and I will see her back in 2-3 weeks thereafter. _____________   History of Present Illness     75 yo female w/ hx tob use, HTN, HLD, DM, FH CAD, PAD. Evaluated by Dr Allyson Sabal for claudication, admitted 10/23 for PV cath.  Hospital Course     Consultants: None   Pt had PTA to her L-EIA and tolerated the procedure well.   Overnight, she had 3 episodes of atrial fibrillation with RVR. She was asymptomatic with these. She is on Bystolic and this was increased. Her diuretics were held because she was not volume overloaded and her renal function had improved. An echo and TSH were ordered.  On 10/24, she was seen by Dr [place] and all data were reviewed. Once the echo was performed and he TSH was drawn, no further inpatient workup was indicated and she was considered stable for discharge, to follow up  as and outpatient.   _____________  Discharge Vitals Blood pressure (!) 150/52, pulse 74, temperature 97.6 F (36.4 C), temperature source Axillary, resp. rate 15, height 5\' 4"  (1.626 m), weight 144 lb 10 oz (65.6 kg), SpO2 94 %.  Filed Weights   01/24/16 0608 01/25/16 0303  Weight: 137 lb (62.1 kg) 144 lb 10 oz (65.6 kg)    Labs & Radiologic Studies    CBC  Recent  Labs  01/25/16 0329  WBC 7.0  HGB 13.9  HCT 40.0  MCV 92.2  PLT 68*   Basic Metabolic Panel  Recent Labs  01/25/16 0329  NA 135  K 3.9  CL 104  CO2 23  GLUCOSE 226*  BUN 23*  CREATININE 1.14*  CALCIUM 9.5   _____________   Disposition   Pt is being discharged home today in good condition.  Follow-up Plans & Appointments    Follow-up Information    [facility] Follow up on 02/01/2016.   Specialty:  Cardiology Why:  Ultrasound (Dopplers) of your legs at 9:00 am, please arrive 15 minutes early for paperwork.  Contact information: [address] [place] [address] [phone]       Theodore Demark, PA-C Follow up on 02/14/2016.   Specialties:  Cardiology, Radiology Why:  See provider at 10:30 am, please arrive 15 minutes early for paperwork. Contact information: [address] [place] [address] [phone]          Discharge Instructions    Diet - low sodium heart healthy    Complete by:  As directed    Increase activity slowly    Complete by:  As directed       Discharge Medications   Current Discharge Medication List    START taking these medications   Details  clopidogrel (PLAVIX) 75 MG tablet Take 1 tablet (75 mg total) by mouth daily with breakfast. Qty: 30 tablet, Refills: 11    Rivaroxaban (XARELTO) 15 MG TABS tablet Take 1 tablet (15 mg total) by mouth daily. Qty: 30 tablet, Refills: 11      CONTINUE these medications which have CHANGED   Details  BYSTOLIC 10 MG tablet Take 2 tablets (20 mg total) by mouth daily. Qty: 60 tablet, Refills: 4      CONTINUE these medications which have NOT CHANGED   Details  donepezil (ARICEPT) 5 MG tablet Take 5 mg by mouth at bedtime.    DULoxetine (CYMBALTA) 30 MG capsule Take 30 mg by mouth daily. Refills: 5    insulin regular human CONCENTRATED (HUMULIN R) 500 UNIT/ML injection Inject 35 Units into the skin 3 (three) times daily with  meals.    LEVEMIR FLEXTOUCH 100 UNIT/ML Pen Inject 35 Units into the skin daily.     levothyroxine (SYNTHROID, LEVOTHROID) 50 MCG tablet Take 50 mcg by mouth daily. Refills: 2    linagliptin (TRADJENTA) 5 MG TABS tablet Take 5 mg by mouth daily.    LYRICA 50 MG capsule Take 50 mg by mouth at bedtime. Refills: 1    metFORMIN (GLUCOPHAGE) 500 MG tablet Take 500 mg by mouth daily.     Methylfol-Methylcob-Acetylcyst (CEREFOLIN NAC PO) Take 1 tablet by mouth daily.    NOVOFINE 32G X 6 MM MISC USE AS DIRECTED TO TEST 3 TIMES DAILY Refills: 2    REPATHA SURECLICK 140 MG/ML SOAJ Inject 140 mg into the skin every 14 (fourteen) days.     traMADol (ULTRAM) 50 MG tablet Take 50 mg  by mouth every 6 (six) hours as needed for moderate pain.       STOP taking these medications     amLODipine (NORVASC) 10 MG tablet      furosemide (LASIX) 40 MG tablet      spironolactone (ALDACTONE) 50 MG tablet          Outstanding Labs/Studies   TSH, Echo  Duration of Discharge Encounter   Greater than 30 minutes including physician time.  Melida QuitterSigned, Rickard Kennerly NP 01/25/2016, 2:24 PM

## 2016-01-25 NOTE — Progress Notes (Signed)
Patient Name: Krista Arroyo Date of Encounter: 01/25/2016  Principal Problem:   Claudication [facility]) Active Problems:   Thrombocytopenia (HCC)   Hypothyroidism   PAF (paroxysmal atrial fibrillation) [facility])   Primary Cardiologist: Dr Allyson Sabal Patient Profile: 75 yo female w/ hx tob use, HTN, HLD, DM, FH CAD, PAD. Evaluated by Dr Allyson Sabal for claudication, admitted 10/23 for PV cath. PAF seen overnight, new dx. Echo ordered.   SUBJECTIVE: Pt feels a little weak but generally ok. No palpitations, no awareness of atrial fib. Wants to go home alone, has daughter and adult grandchildren nearby. Lives alone and feels she will be ok. May be willing to quit tobacco.   OBJECTIVE Vitals:   01/24/16 1611 01/24/16 1917 01/25/16 0303 01/25/16 0735  BP: (!) 126/57 (!) 142/61 (!) 154/69 (!) 128/41  Pulse: 92 (!) 130 71 76  Resp: 19 19 (!) 22 14  Temp: 98 F (36.7 C) 98.8 F (37.1 C) 98.4 F (36.9 C) 99.1 F (37.3 C)  TempSrc: Oral Oral Oral Oral  SpO2:  96% 96% 95%  Weight:   144 lb 10 oz (65.6 kg)   Height:        Intake/Output Summary (Last 24 hours) at 01/25/16 0833 Last data filed at 01/25/16 0743  Gross per 24 hour  Intake          1016.25 ml  Output             1500 ml  Net          -483.75 ml   Filed Weights   01/24/16 0608 01/25/16 0303  Weight: 137 lb (62.1 kg) 144 lb 10 oz (65.6 kg)    PHYSICAL EXAM General: Well developed, well nourished, female in no acute distress. Head: Normocephalic, atraumatic.  Neck: Supple without bruits, JVD not elevated. Lungs:  Resp regular and unlabored, some rales, slight exp wheeze, few rhonchi. Heart: RRR, S1, S2, no S3, S4, 2/6 murmur; no rub. Abdomen: Soft, non-tender, non-distended, BS + x 4.  Extremities: No clubbing, cyanosis, edema. R fem pulse weak, small hematoma R, loud bruit L, distal pulses intact.  Neuro: Alert and oriented X 3. Moves all extremities spontaneously. Psych: Normal affect.  LABS: CBC:  Recent Labs  01/25/16 0329  WBC 7.0  HGB 13.9  HCT 40.0  MCV 92.2  PLT 68*   Basic Metabolic Panel:  Recent Labs  16/10/96 0329  NA 135  K 3.9  CL 104  CO2 23  GLUCOSE 226*  BUN 23*  CREATININE 1.14*  CALCIUM 9.5   TELE:  SR, 3 episodes atrial fib, all w/ intermittent RVR. 30", 1 hr and 2.5 hr.      ECG: 10/23 @ 16:45  Atrial fib, RVR, HR 136  CATH: 01/24/2016 Angiographic Data:  1: Abdominal aortogram-the abdominal aorta was fluoroscopically calcified. 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 2: Left lower extremity-there was a 90-95% segmental stenosis in the left external iliac artery just after the takeoff of the hypogastric artery. 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 We will proceed with PTA and self expanding stenting. Procedure Description: The patient received 6000 units of heparin intravenously with an ACT of 226. She also received 300 mg of by mouth Plavix at the end of the case. I was able to cross the lesion with a 5  [place] end hole catheter and a 035 angled Glidewire which I then exchanged for a 0.35  Versicore wire. Following this I predilated the diseased segment with a 4 mm x 4 cm balloon and ploidy a 8 mm x 60 mm Abbott absolute Pro nitinol self expanding stent. I postdilated with a 6 mm x 4 cm balloon resulting in reduction of a long 90-95% stenosis in the left external iliac artery to less than 10% residual. There was excellent flow and no resting gradient. The bright tip sheath was exchanged over the wire for a short 6 [place] sheath. The Omni Flush was removed over wire and both sheaths were secured in place. The patient left the lab in stable condition. Final Impression: Successful left external iliac artery PTA and self expanding stenting for high-grade segmental left external  iliac artery stenosis and lifestyle limiting claudication. The patient does have known thrombocytopenia with a platelet count in the mid 80,000 range. I do not think this will be an issue regarding antiplatelet therapy. The sheath will be removed once the ACT is documented to be less than 170 and pressure held. The patient will be hydrated overnight and discharged home in the morning on dual antiplatelet therapy. She will obtain lower extremity arterial Doppler studies in our [facility] line office next week and I will see her back in 2-3 weeks thereafter.  Radiology/Studies: No results found.   Current Medications:  . amLODipine  10 mg Oral Daily  . aspirin EC  81 mg Oral Daily  . clopidogrel  75 mg Oral Q breakfast  . donepezil  5 mg Oral QHS  . DULoxetine  30 mg Oral Daily  . furosemide  40 mg Oral Daily  . insulin aspart  0-15 Units Subcutaneous TID WC  . levothyroxine  50 mcg Oral QAC breakfast  . linagliptin  5 mg Oral Daily  . nebivolol  10 mg Oral Daily  . pregabalin  50 mg Oral QHS  . spironolactone  50 mg Oral Daily      ASSESSMENT AND PLAN: Principal Problem:   Claudication (HCC) - s/p PTA and stenting L-EIA  Active Problems:   Thrombocytopenia (HCC) - plt as low as 61 in 2016 - 68 now, no bleeding noted.    Hypothyroidism - on home dose levothyroxine - Ck TSH    PAF (paroxysmal atrial fibrillation) (HCC) - new dx, asymptomatic - on Bystolic 10 mg qd PTA - CHA2DS2VASc=6 (age x 2, female, HTN, DM, PAD) however not sure about anticoag and DAPT w/ chronically low platelets. - MD advise on Holter to measure afib burden. - BB could be increased to 15 mg qd but she would still need anticoag  D/c when medically stable  Signed, Leanna Battles 8:33 AM 01/25/2016 Patient seen and examined and history reviewed. Agree with above findings and plan. Patient developed AFib with RVR last night. Completely asymptomatic. Now back in NSR. Denies groin pain. No  claudication. Denies known history of CHF. States she had been on diuretic due to tendency to swell and because she has a family history of CHF. No prior cardiac evaluation with Echo or stress test. [place] vasc score of at least 6. No history of TIA or CVA. No history of any bleeding.  Will check TSH today and Echo Groin site without hematoma. I would recommend Xarelto for stroke prophylaxis given high [place] vasc score. Since she was asymptomatic with AFib it is difficult to know if she had prior episodes. Will increase bystolic  to 20 mg daily for rate control. Stop ASA on Xarelto but continue Plavix. Avoid NSAIDs. She has no evidence of swelling now so we will continue to hold Aldactone and Lasix given renal insufficiency. Plan to DC later today if Echo is OK.   Peter SwazilandJordan, MDFACC 01/25/2016 9:43 AM

## 2016-01-25 NOTE — Progress Notes (Signed)
Co-pay $5.00 xeralto 15 mg. ,no PA required per benefit check

## 2016-01-25 NOTE — Progress Notes (Signed)
  Echocardiogram 2D Echocardiogram has been performed.  Leta JunglingCooper, Khaleb Broz M 01/25/2016, 2:09 PM

## 2016-01-25 NOTE — Discharge Instructions (Addendum)
PLEASE REMEMBER TO BRING ALL OF YOUR MEDICATIONS TO EACH OF YOUR FOLLOW-UP OFFICE VISITS.  PLEASE ATTEND ALL SCHEDULED FOLLOW-UP APPOINTMENTS.   Activity: Increase activity slowly as tolerated. You may shower, but no soaking baths (or swimming) for 1 week. No driving for 2 days. No lifting over 5 lbs for 1 week. No sexual activity for 1 week.   You May Return to Work: in 1 week (if applicable)  Wound Care: You may wash cath site gently with soap and water. Keep cath site clean and dry. If you notice pain, swelling, bleeding or pus at your cath site, please call [phone].    Cardiac Cath Site Care Refer to this sheet in the next few weeks. These instructions provide you with information on caring for yourself after your procedure. Your caregiver may also give you more specific instructions. Your treatment has been planned according to current medical practices, but problems sometimes occur. Call your caregiver if you have any problems or questions after your procedure. HOME CARE INSTRUCTIONS  You may shower 24 hours after the procedure. Remove the bandage (dressing) and gently wash the site with plain soap and water. Gently pat the site dry.   Do not apply powder or lotion to the site.   Do not sit in a bathtub, swimming pool, or whirlpool for 5 to 7 days.   No bending, squatting, or lifting anything over 10 pounds (4.5 kg) as directed by your caregiver.   Inspect the site at least twice daily.   Do not drive home if you are discharged the same day of the procedure. Have someone else drive you.   You may drive 24 hours after the procedure unless otherwise instructed by your caregiver.  What to expect:  Any bruising will usually fade within 1 to 2 weeks.   Blood that collects in the tissue (hematoma) may be painful to the touch. It should usually decrease in size and tenderness within 1 to 2 weeks.  SEEK IMMEDIATE MEDICAL CARE IF:  You have unusual pain at the site or down the  affected limb.   You have redness, warmth, swelling, or pain at the site.   You have drainage (other than a small amount of blood on the dressing).   You have chills.   You have a fever or persistent symptoms for more than 72 hours.   You have a fever and your symptoms suddenly get worse.   Your leg becomes pale, cool, tingly, or numb.   You have heavy bleeding from the site. Hold pressure on the site.  Document Released: 04/22/2010 Document Revised: 03/09/2011 Document Reviewed: 04/22/2010 [facility] Patient Information 2012 [place], [place].   Information on my medicine - XARELTO (Rivaroxaban)  This medication education was reviewed with me or my healthcare representative as part of my discharge preparation.  The pharmacist that spoke with me during my hospital stay was:  Gardner CandleCarney, Samaya Boardley C, [facility] HospitalRPH  Why was Xarelto prescribed for you? Xarelto was prescribed for you to reduce the risk of a blood clot forming that can cause a stroke if you have a medical condition called atrial fibrillation (a type of irregular heartbeat).  What do you need to know about xarelto ? Take your Xarelto ONCE DAILY at the same time every day with your evening meal. If you have difficulty swallowing the tablet whole, you may crush it and mix in applesauce just prior to taking your dose.  Take Xarelto exactly as prescribed by your doctor and DO NOT stop  taking Xarelto without talking to the doctor who prescribed the medication.  Stopping without other stroke prevention medication to take the place of Xarelto may increase your risk of developing a clot that causes a stroke.  Refill your prescription before you run out.  After discharge, you should have regular check-up appointments with your healthcare provider that is prescribing your Xarelto.  In the future your dose may need to be changed if your kidney function or weight changes by a significant amount.  What do you do if you miss a dose? If you are  taking Xarelto ONCE DAILY and you miss a dose, take it as soon as you remember on the same day then continue your regularly scheduled once daily regimen the next day. Do not take two doses of Xarelto at the same time or on the same day.   Important Safety Information A possible side effect of Xarelto is bleeding. You should call your healthcare provider right away if you experience any of the following: ? Bleeding from an injury or your nose that does not stop. ? Unusual colored urine (red or dark brown) or unusual colored stools (red or black). ? Unusual bruising for unknown reasons. ? A serious fall or if you hit your head (even if there is no bleeding).  Some medicines may interact with Xarelto and might increase your risk of bleeding while on Xarelto. To help avoid this, consult your healthcare provider or pharmacist prior to using any new prescription or non-prescription medications, including herbals, vitamins, non-steroidal anti-inflammatory drugs (NSAIDs) and supplements.  This website has more information on Xarelto: VisitDestination.com.br.

## 2016-01-25 NOTE — Care Management Note (Addendum)
Case Management Note  Patient Details  Name: Krista CriglerCarol A Osmond MRN: [id number] Date of Birth: 11/14/1940  Subjective/Objective:   S/p pv intervention, patient will be on plavix and asa, lives alone, pta indep, has pcp, and medication coverage and transportation.  NCM received call from RN stating MD wants to start patient on xarelto, awaiting benefit check.  Gave patient 30 day savings card for xarelto.    Per benefit check for xarelto Co-pay $5.00 xeralto 15 mg. ,no PA required               Action/Plan:   Expected Discharge Date:                  Expected Discharge Plan:  Home/Self Care  In-House Referral:     Discharge planning Services  CM Consult  Post Acute Care Choice:    Choice offered to:     DME Arranged:    DME Agency:     HH Arranged:    HH Agency:     Status of Service:  Completed, signed off  If discussed at [employer] Meetings, dates discussed:    Additional Comments:  Leone Havenaylor, Birl Lobello Clinton, RN 01/25/2016, 2:46 PM

## 2016-01-28 ENCOUNTER — Other Ambulatory Visit: Payer: Self-pay | Admitting: Cardiovascular Disease

## 2016-01-28 DIAGNOSIS — I739 Peripheral vascular disease, unspecified: Secondary | ICD-10-CM

## 2016-02-01 ENCOUNTER — Ambulatory Visit (HOSPITAL_COMMUNITY)
Admission: RE | Admit: 2016-02-01 | Discharge: 2016-02-01 | Disposition: A | Discharge status: Home / Self Care | Payer: Medicare Other | Source: Ambulatory Visit | Attending: Cardiovascular Disease | Admitting: Cardiovascular Disease

## 2016-02-01 ENCOUNTER — Encounter (HOSPITAL_COMMUNITY): Payer: Medicare Other

## 2016-02-01 DIAGNOSIS — Z720 Tobacco use: Secondary | ICD-10-CM | POA: Diagnosis not present

## 2016-02-01 DIAGNOSIS — E1151 Type 2 diabetes mellitus with diabetic peripheral angiopathy without gangrene: Secondary | ICD-10-CM | POA: Diagnosis not present

## 2016-02-01 DIAGNOSIS — I700 Atherosclerosis of aorta: Secondary | ICD-10-CM | POA: Insufficient documentation

## 2016-02-01 DIAGNOSIS — I10 Essential (primary) hypertension: Secondary | ICD-10-CM | POA: Insufficient documentation

## 2016-02-01 DIAGNOSIS — R938 Abnormal findings on diagnostic imaging of other specified body structures: Secondary | ICD-10-CM | POA: Diagnosis not present

## 2016-02-01 DIAGNOSIS — I739 Peripheral vascular disease, unspecified: Secondary | ICD-10-CM | POA: Diagnosis present

## 2016-02-01 DIAGNOSIS — I708 Atherosclerosis of other arteries: Secondary | ICD-10-CM | POA: Insufficient documentation

## 2016-02-01 DIAGNOSIS — E785 Hyperlipidemia, unspecified: Secondary | ICD-10-CM | POA: Diagnosis not present

## 2016-02-08 ENCOUNTER — Encounter: Payer: Self-pay | Admitting: Physician Assistant

## 2016-02-08 ENCOUNTER — Telehealth: Payer: Self-pay | Admitting: *Deleted

## 2016-02-08 DIAGNOSIS — I739 Peripheral vascular disease, unspecified: Secondary | ICD-10-CM

## 2016-02-08 NOTE — Telephone Encounter (Signed)
-----   Message from Runell GessJonathan J Berry, MD sent at 02/08/2016  1:00 AM EST ----- Improved ABI status post left iliac intervention. Repeat 6 months

## 2016-02-14 ENCOUNTER — Ambulatory Visit: Payer: Medicare Other | Admitting: Physician Assistant

## 2016-02-15 NOTE — Progress Notes (Signed)
Cardiology Office Note   Date:  02/17/2016   ID:  Krista Arroyo, DOB 03/02/1941, MRN [id number]  PCP:  Krista Ok, MD  Cardiologist:  Dr Krista Hamburger, PA-C   Chief Complaint  Patient presents with  . Follow-up    Doppler, questions about brusing on Plavix    History of Present Illness: Krista Arroyo is a 75 y.o. female with a history of HTN, HLD, DM, FH CAD, PAD.  Admitted 10/23-10/24 for PV cath and had PTA to her L-EIA.  10/31 had repeat ABIs, R- 0.97, L-improved and now 0.95, TBI R-0.45, L-0.65. Repeat 6 months.   Krista Arroyo presents for post-hospital f/u. She is here today with her granddaughter.  She is consistently taking her BP medications, her daughter makes sure. To maximize compliance, she takes all of her medications when her daughter comes by in the evenings. Her daughter was not able to come by last pm, but pt says she took her pills.   She is steadier on her feet, she is going out more. She has more energy and is feeling better. She is having less leg pain. Her cath site has healed in well.   No chest pain or SOB. She has a large bruise on her L arm, has some other smaller bruises since being on Plavix. She wonders if this is due to the Plavix.    Past Medical History:  Diagnosis Date  . Anxiety   . HHD (hypertensive heart disease)   . History of hiatal hernia   . Hyperlipidemia   . Hypertension   . Hypothyroidism   . PAD (peripheral artery disease) (HCC)   . PAF (paroxysmal atrial fibrillation) (HCC) 01/25/2016  . Type II diabetes mellitus (HCC)     Past Surgical History:  Procedure Laterality Date  . ABDOMINAL HYSTERECTOMY    . APPENDECTOMY    . CATARACT EXTRACTION W/ INTRAOCULAR LENS  IMPLANT, BILATERAL Bilateral   . I&D EXTREMITY Right 08/23/2014   Procedure: IRRIGATION AND DEBRIDEMENT RIGHT THUMB;  Surgeon: Betha Loa, MD;  Location: [facility] OR;  Service: Orthopedics;  Laterality: Right;  . INCISION AND DRAINAGE Right  09/11/2014   Procedure: INCISION AND DRAINAGE RIGHT THUMB;  Surgeon: Betha Loa, MD;  Location: [facility];  Service: Orthopedics;  Laterality: Right;  . JOINT REPLACEMENT    . PERIPHERAL VASCULAR CATHETERIZATION N/A 01/24/2016   Procedure: Lower Extremity Angiography;  Surgeon: Runell Gess, MD;  Location: [facility] INVASIVE CV LAB;  Service: Cardiovascular;  Laterality: N/A;  . PERIPHERAL VASCULAR CATHETERIZATION Left 01/24/2016   Procedure: Peripheral Vascular Intervention;  Surgeon: Runell Gess, MD;  Location: [facility] INVASIVE CV LAB;  Service: Cardiovascular;  Laterality: Left;  . RENAL ARTERY STENT    . TOTAL KNEE ARTHROPLASTY Right     Current Outpatient Prescriptions  Medication Sig Dispense Refill  . BYSTOLIC 10 MG tablet Take 2 tablets (20 mg total) by mouth daily. 60 tablet 4  . clopidogrel (PLAVIX) 75 MG tablet Take 1 tablet (75 mg total) by mouth daily with breakfast. 30 tablet 11  . donepezil (ARICEPT) 5 MG tablet Take 5 mg by mouth at bedtime.    . DULoxetine (CYMBALTA) 30 MG capsule Take 30 mg by mouth 2 (two) times daily.   5  . insulin regular human CONCENTRATED (HUMULIN R) 500 UNIT/ML injection Inject 35 Units into the skin 3 (three) times daily with meals.    Marland Kitchen LEVEMIR FLEXTOUCH 100 UNIT/ML Pen Inject 35 Units into  the skin daily.     Marland Kitchen. levothyroxine (SYNTHROID, LEVOTHROID) 50 MCG tablet Take 50 mcg by mouth daily.  2  . linagliptin (TRADJENTA) 5 MG TABS tablet Take 5 mg by mouth daily.    Marland Kitchen. LYRICA 50 MG capsule Take 50 mg by mouth at bedtime.  1  . metFORMIN (GLUCOPHAGE) 500 MG tablet Take 500 mg by mouth daily.     . Methylfol-Methylcob-Acetylcyst (CEREFOLIN NAC PO) Take 1 tablet by mouth daily.    Marland Kitchen. NOVOFINE 32G X 6 MM MISC USE AS DIRECTED TO TEST 3 TIMES DAILY  2  . REPATHA SURECLICK 140 MG/ML SOAJ Inject 140 mg into the skin every 14 (fourteen) days.     . Rivaroxaban (XARELTO) 15 MG TABS tablet Take 1 tablet (15 mg total) by mouth daily. 30 tablet 11    . traMADol (ULTRAM) 50 MG tablet Take 50 mg by mouth every 6 (six) hours as needed for moderate pain.      No current facility-administered medications for this visit.     Allergies:   Influenza vaccines    Social History:  The patient  reports that she has been smoking Cigarettes.  She has a 27.50 pack-year smoking history. She has never used smokeless tobacco. She reports that she drinks alcohol. She reports that she does not use drugs.   Family History:  The patient's family history includes Diabetes Mellitus II in her mother; Heart attack in her brother.    ROS:  Please see the history of present illness. All other systems are reviewed and negative.    PHYSICAL EXAM: Repeat BP 152/86 VS:  BP (!) 200/90   Pulse 71   Ht 5\' 4"  (1.626 m)   Wt 135 lb 6.4 oz (61.4 kg)   BMI 23.24 kg/m  , BMI Body mass index is 23.24 kg/m. GEN: Well nourished, well developed, female in no acute distress  HEENT: normal for age  Neck: no JVD, no carotid bruit, no masses Cardiac: RRR; soft murmur, no rubs, or gallops Respiratory:  Decreased BS bases w/ few rales and occ rhonchi bilaterally, normal work of breathing GI: soft, nontender, nondistended, + BS MS: no deformity or atrophy; no edema; distal pulses are 2+ in 3/4 extremities, R DP slightly decreased  Skin: warm and dry, no rash; 6 x 6 cm area ecchymosis L forearm, several smaller areas R forearm, no hematomas Neuro:  Strength and sensation are intact Psych: euthymic mood, full affect   EKG:  EKG is not ordered today.  Recent Labs: 01/25/2016: BUN 23; Creatinine, Ser 1.14; Hemoglobin 13.9; Platelets 68; Potassium 3.9; Sodium 135; TSH 1.757    Lipid Panel No results found for: CHOL, TRIG, HDL, CHOLHDL, VLDL, LDLCALC, LDLDIRECT   Wt Readings from Last 3 Encounters:  02/17/16 135 lb 6.4 oz (61.4 kg)  01/25/16 144 lb 10 oz (65.6 kg)  11/24/15 131 lb (59.4 kg)     Other studies Reviewed: Additional studies/ records that were reviewed  today include: office notes and hospital records.  ASSESSMENT AND PLAN:  1.  PAD: She has recovered well from the procedure and her symptoms are significantly improved. She is compliant with her medications. She is not having any ongoing signs of ischemia. Continue current therapy.  2. HTN: BP was much improved on recheck. It is not realistic to give her any bid medications. With family support, she is compliant w/ qd rx. Continue with no med changes. Could add amlodipine if better BP control needed. As she may  not have taken rx last pm, will not make any med changes right now.   3. Thrombocytopenia: Plt 68000 at time of procedure, normal for her. She is having some bruising, but do not think she is bleeding significantly. Continue Plavix/Xarelto. She is encouraged to wear long sleeves, long pants and sock/shoes all the time.   Current medicines are reviewed at length with the patient today.  The patient has concerns regarding medicines. Concerns were addressed.  The following changes have been made:  no change  Labs/ tests ordered today include:  No orders of the defined types were placed in this encounter.    Disposition:   FU with Dr Allyson SabalBerry  Signed, Theodore DemarkBarrett, Season Astacio, PA-C  02/17/2016 8:19 AM    [facility] Phone: [phone]; Fax: [phone]  This note was written with the assistance of speech recognition software. Please excuse any transcriptional errors.

## 2016-02-17 ENCOUNTER — Ambulatory Visit (INDEPENDENT_AMBULATORY_CARE_PROVIDER_SITE_OTHER): Payer: Medicare Other | Admitting: Physician Assistant

## 2016-02-17 ENCOUNTER — Encounter: Payer: Self-pay | Admitting: Physician Assistant

## 2016-02-17 VITALS — BP 200/90 | HR 71 | Ht 64.0 in | Wt 135.4 lb

## 2016-02-17 DIAGNOSIS — I739 Peripheral vascular disease, unspecified: Secondary | ICD-10-CM

## 2016-02-17 DIAGNOSIS — I10 Essential (primary) hypertension: Secondary | ICD-10-CM

## 2016-02-17 DIAGNOSIS — D696 Thrombocytopenia, unspecified: Secondary | ICD-10-CM | POA: Diagnosis not present

## 2016-02-17 DIAGNOSIS — I6529 Occlusion and stenosis of unspecified carotid artery: Secondary | ICD-10-CM

## 2016-02-17 NOTE — Patient Instructions (Signed)
Medication Instructions:  Continue current medications  Labwork: None Ordered   Testing/Procedures: None Orderede  Follow-Up: Your physician recommends that you schedule a follow-up appointment in: 3 Months with Dr Allyson SabalBerry   Any Other Special Instructions Will Be Listed Below (If Applicable).     If you need a refill on your cardiac medications before your next appointment, please call your pharmacy.

## 2016-05-17 ENCOUNTER — Ambulatory Visit: Payer: Medicare Other | Admitting: Cardiovascular Disease

## 2016-05-18 ENCOUNTER — Telehealth: Payer: Self-pay | Admitting: Cardiovascular Disease

## 2016-05-18 NOTE — Telephone Encounter (Signed)
Received records from Dr Ralene Okoy Moreira for appointment on 05/23/16 with Dr Allyson SabalBerry.  Records put with Dr Hazle CocaBerry's schedule for 05/23/16. lp

## 2016-05-23 ENCOUNTER — Encounter: Payer: Self-pay | Admitting: Cardiovascular Disease

## 2016-05-23 ENCOUNTER — Ambulatory Visit (INDEPENDENT_AMBULATORY_CARE_PROVIDER_SITE_OTHER): Payer: Medicare Other | Admitting: Cardiovascular Disease

## 2016-05-23 VITALS — BP 150/68 | HR 73 | Ht 64.0 in | Wt 135.0 lb

## 2016-05-23 DIAGNOSIS — E7800 Pure hypercholesterolemia, unspecified: Secondary | ICD-10-CM | POA: Diagnosis not present

## 2016-05-23 DIAGNOSIS — I739 Peripheral vascular disease, unspecified: Secondary | ICD-10-CM | POA: Diagnosis not present

## 2016-05-23 DIAGNOSIS — I480 Paroxysmal atrial fibrillation: Secondary | ICD-10-CM

## 2016-05-23 DIAGNOSIS — I10 Essential (primary) hypertension: Secondary | ICD-10-CM | POA: Diagnosis not present

## 2016-05-23 NOTE — Assessment & Plan Note (Signed)
History of hypertension blood pressure measured 150/68. She is on Bystolic . Continue current meds at current dosing

## 2016-05-23 NOTE — Progress Notes (Signed)
05/23/2016 Krista CriglerCarol A Arroyo   03/05/1941  [id number]  Primary Physician Ralene OkMOREIRA,ROY, MD Primary Cardiologist: Runell GessJonathan J Berry MD Roseanne RenoFACP, FACC, FAHA, FSCAI  HPI:  Krista Arroyo is a very pleasant 76 year old thin-appearing widowed Caucasian female mother of 2 living children, grandmother to 5 grandchildren is accompanied by her granddaughter Victorino DikeJennifer. I last saw her in the office 11/24/15. She was referred by Dr. Ludwig ClarksMoreira for peripheral vascular evaluation because of lifestyle limiting claudication. I last saw her in the office 10/22/15. Her cardiovascular risk factor profile is notable for 50-pack-years of tobacco abuse continued to smoke a pack a day. She has history of hypertension, hyperlipidemia and diabetes. Her brother did die of a myocardial infarction in his early 6060s. She has never had a heart attack or stroke and denies chest pain or shortness of breath. She does have lifestyle limiting claudication left greater than right. Dopplers performed 2/17 revealed a right ABI 0.7 and a left of . 36. Dopplers performed 11/02/15 revealed a right ABI 1.1 with a moderately elevated-signal in the right common iliac artery and a left ABI 0.69 with what appears to be an occluded left common iliac artery.  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 Her claudication has improved as have her Dopplers.   Current Outpatient Prescriptions  Medication Sig Dispense Refill  . BYSTOLIC 10 MG tablet Take 2 tablets (20 mg total) by mouth daily. 60 tablet 4  . clopidogrel (PLAVIX) 75 MG tablet Take 1 tablet (75 mg total) by mouth daily with breakfast. 30 tablet 11  . donepezil (ARICEPT) 5 MG tablet Take 5 mg by mouth at bedtime.    . DULoxetine (CYMBALTA) 30 MG capsule Take 30 mg by mouth 2 (two) times daily.   5  . insulin regular  human CONCENTRATED (HUMULIN R) 500 UNIT/ML injection Inject 35 Units into the skin 2 (two) times daily with a meal.     . LEVEMIR FLEXTOUCH 100 UNIT/ML Pen Inject 35 Units into the skin daily.     Marland Kitchen. levothyroxine (SYNTHROID, LEVOTHROID) 50 MCG tablet Take 50 mcg by mouth daily.  2  . linagliptin (TRADJENTA) 5 MG TABS tablet Take 5 mg by mouth daily.    Marland Kitchen. LYRICA 50 MG capsule Take 50 mg by mouth at bedtime.  1  . metFORMIN (GLUCOPHAGE) 500 MG tablet Take 500 mg by mouth daily.     . Methylfol-Methylcob-Acetylcyst (CEREFOLIN NAC PO) Take 1 tablet by mouth daily.    Marland Kitchen. NOVOFINE 32G X 6 MM MISC USE AS DIRECTED TO TEST 3 TIMES DAILY  2  . REPATHA SURECLICK 140 MG/ML SOAJ Inject 140 mg into the skin every 14 (fourteen) days.     . Rivaroxaban (XARELTO) 15 MG TABS tablet Take 1 tablet (15 mg total) by mouth daily. 30 tablet 11  . traMADol (ULTRAM) 50 MG tablet Take 50 mg by mouth every 6 (six) hours as needed for moderate pain.      No current facility-administered medications for this visit.     Allergies  Allergen Reactions  . Influenza Vaccines Other (See Comments)    Sore arm and swollen spot to injection site    Social History   Social History  . Marital status: Widowed    Spouse name: N/A  . Number of children: N/A  . Years of  education: N/A   Occupational History  . Not on file.   Social History Main Topics  . Smoking status: Current Every Day Smoker    Packs/day: 0.50    Years: 55.00    Types: Cigarettes  . Smokeless tobacco: Never Used  . Alcohol use Yes     Comment: 01/24/2016 "just at the holidays"  . Drug use: No  . Sexual activity: Not on file   Other Topics Concern  . Not on file   Social History Narrative  . No narrative on file     Review of Systems: General: negative for chills, fever, night sweats or weight changes.  Cardiovascular: negative for chest pain, dyspnea on exertion, edema, orthopnea, palpitations, paroxysmal nocturnal dyspnea or shortness of  breath Dermatological: negative for rash Respiratory: negative for cough or wheezing Urologic: negative for hematuria Abdominal: negative for nausea, vomiting, diarrhea, bright red blood per rectum, melena, or hematemesis Neurologic: negative for visual changes, syncope, or dizziness All other systems reviewed and are otherwise negative except as noted above.    Blood pressure (!) 150/68, pulse 73, height 5\' 4"  (1.626 m), weight 135 lb (61.2 kg).  General appearance: alert and no distress Neck: no adenopathy, no JVD, supple, symmetrical, trachea midline, thyroid not enlarged, symmetric, no tenderness/mass/nodules and Soft bilateral carotid bruits Lungs: clear to auscultation bilaterally Heart: regular rate and rhythm, S1, S2 normal, no murmur, click, rub or gallop Extremities: extremities normal, atraumatic, no cyanosis or edema  EKG sinus rhythm at 73 without ST or T-wave changes. I personally reviewed this EKG  ASSESSMENT AND PLAN:   Claudication [facility]) History of claudication status post left external iliac artery PTA and stenting 01/24/16 for a 90% lesion with 8 mm x 60 mm long suffers been extended. Her post procedure Dopplers improved as did her claudication. She did have a 50% ostial right common iliac artery stenosis but really denies claudication. We'll continue to follow her noninvasively on a semiannual basis.  Essential hypertension History of hypertension blood pressure measured 150/68. She is on Bystolic . Continue current meds at current dosing  Hyperlipidemia History of hyperlipidemia on Repatha followed by her PCP      Runell Gess MD [facility], [facility] 05/23/2016 2:58 PM

## 2016-05-23 NOTE — Assessment & Plan Note (Signed)
History of hyperlipidemia on Repatha  followed by her PCP 

## 2016-05-23 NOTE — Assessment & Plan Note (Signed)
History of claudication status post left external iliac artery PTA and stenting 01/24/16 for a 90% lesion with 8 mm x 60 mm long suffers been extended. Her post procedure Dopplers improved as did her claudication. She did have a 50% ostial right common iliac artery stenosis but really denies claudication. We'll continue to follow her noninvasively on a semiannual basis.

## 2016-05-23 NOTE — Patient Instructions (Signed)
Medication Instructions: Your physician recommends that you continue on your current medications as directed. Please refer to the Current Medication list given to you today.   Testing/Procedures: Your physician has requested that you have a aorta and iliac duplex every 6 months. During this test, an ultrasound is used to evaluate blood flow to the aorta and iliac arteries. Allow one hour for this exam. Do not eat after midnight the day before and avoid carbonated beverages.    Follow-Up: Your physician wants you to follow-up in: 1 year with Dr. Berry. You will receive a reminder letter in the mail two months in advance. If you don't receive a letter, please call our office to schedule the follow-up appointment.  If you need a refill on your cardiac medications before your next appointment, please call your pharmacy.  

## 2016-10-17 IMAGING — CR DG LUMBAR SPINE COMPLETE 4+V
5 series · 5 of 5 positions shown · non-contrast
Comparison: None.

CLINICAL DATA: 74-year-old with bilateral hip and bilateral lower
extremity pain and weakness which began several weeks ago. No known
injury.

EXAM:
LUMBAR SPINE - COMPLETE 4+ VIEW

[t l-spine a.p.]
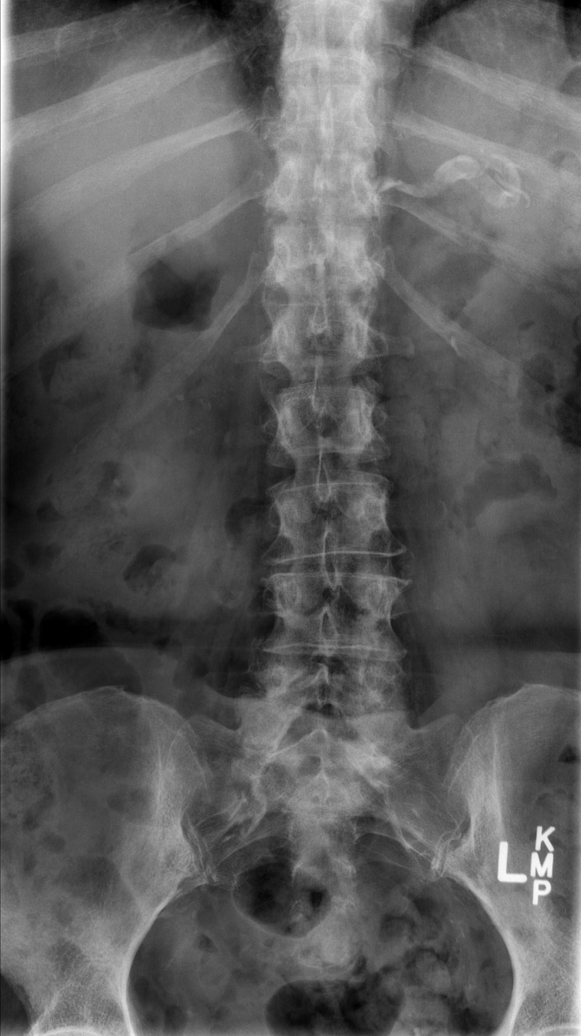

[t l-spine oblique exposure (1 of 2)]
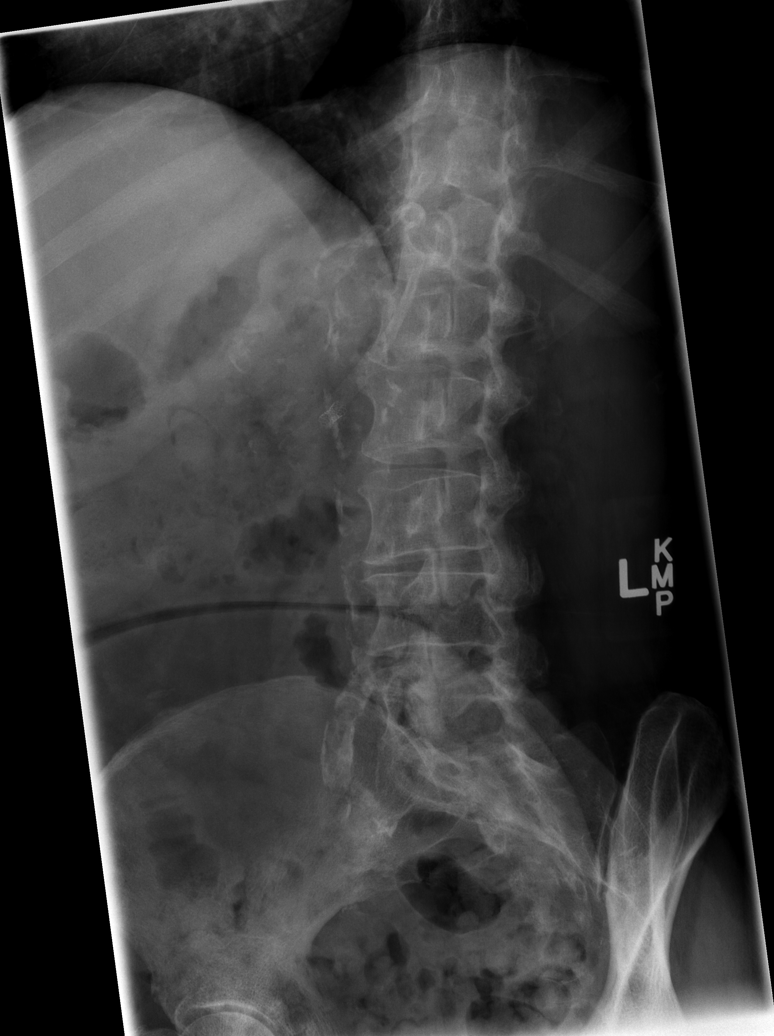

[t l-spine oblique exposure (2 of 2)]
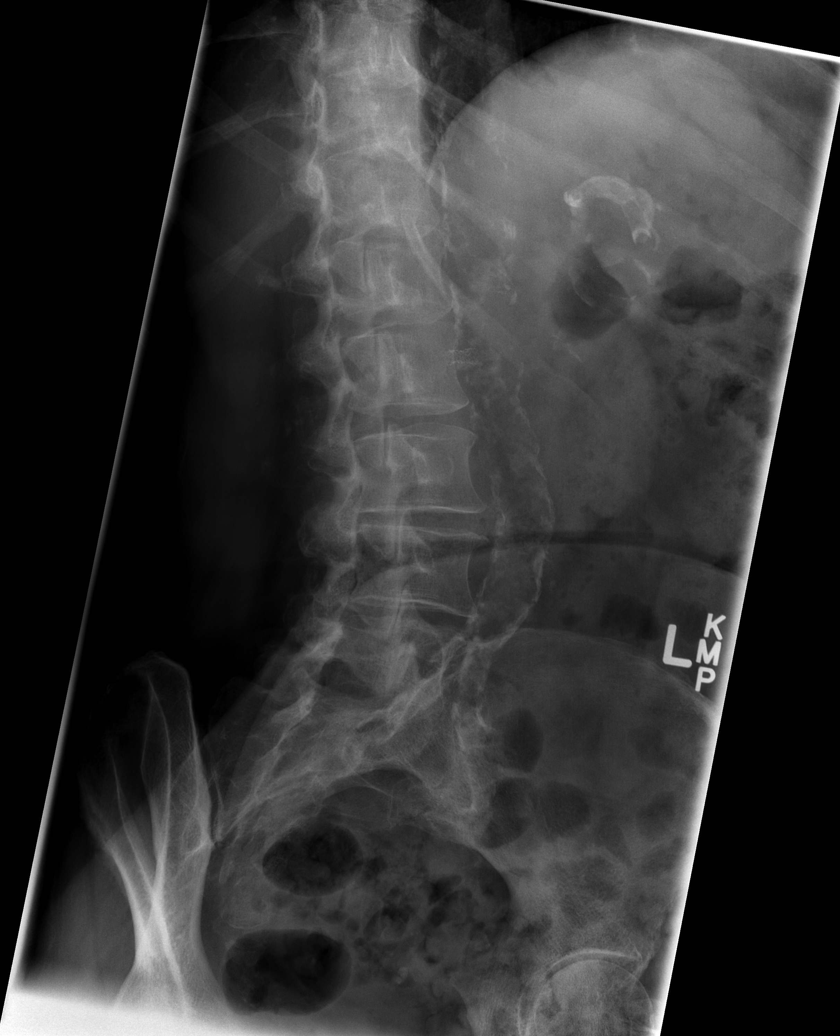

[t l-spine lat]
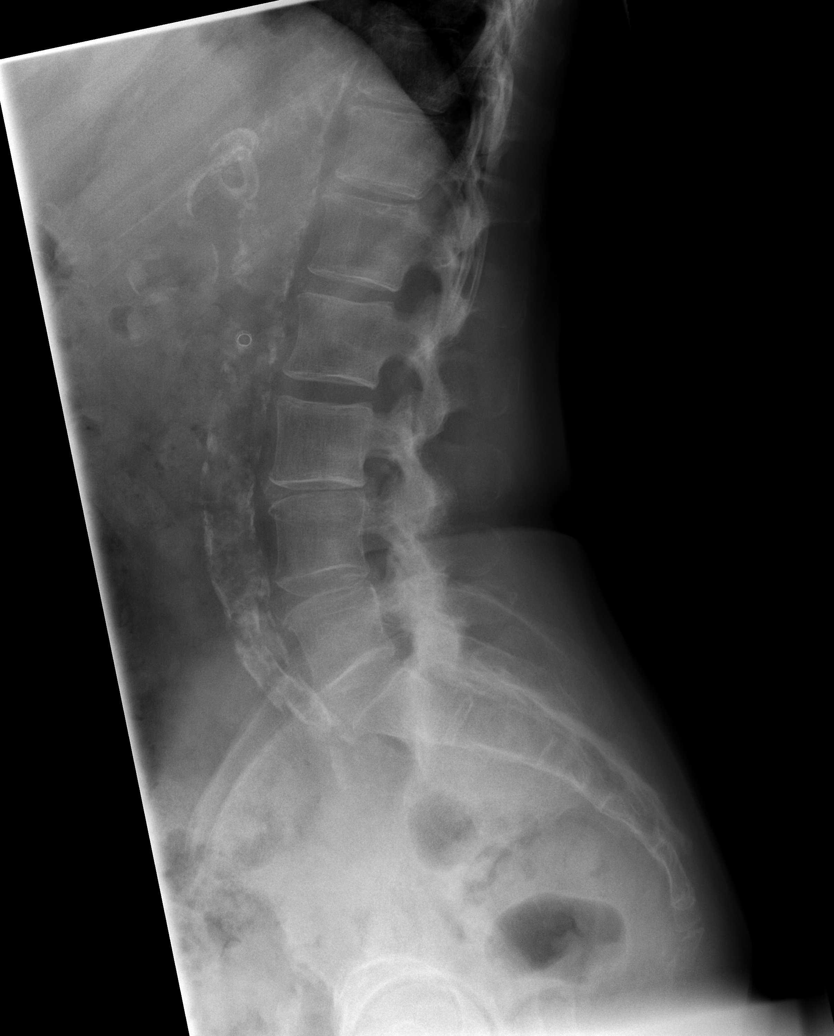

[t l-spine l5-s1 spot]
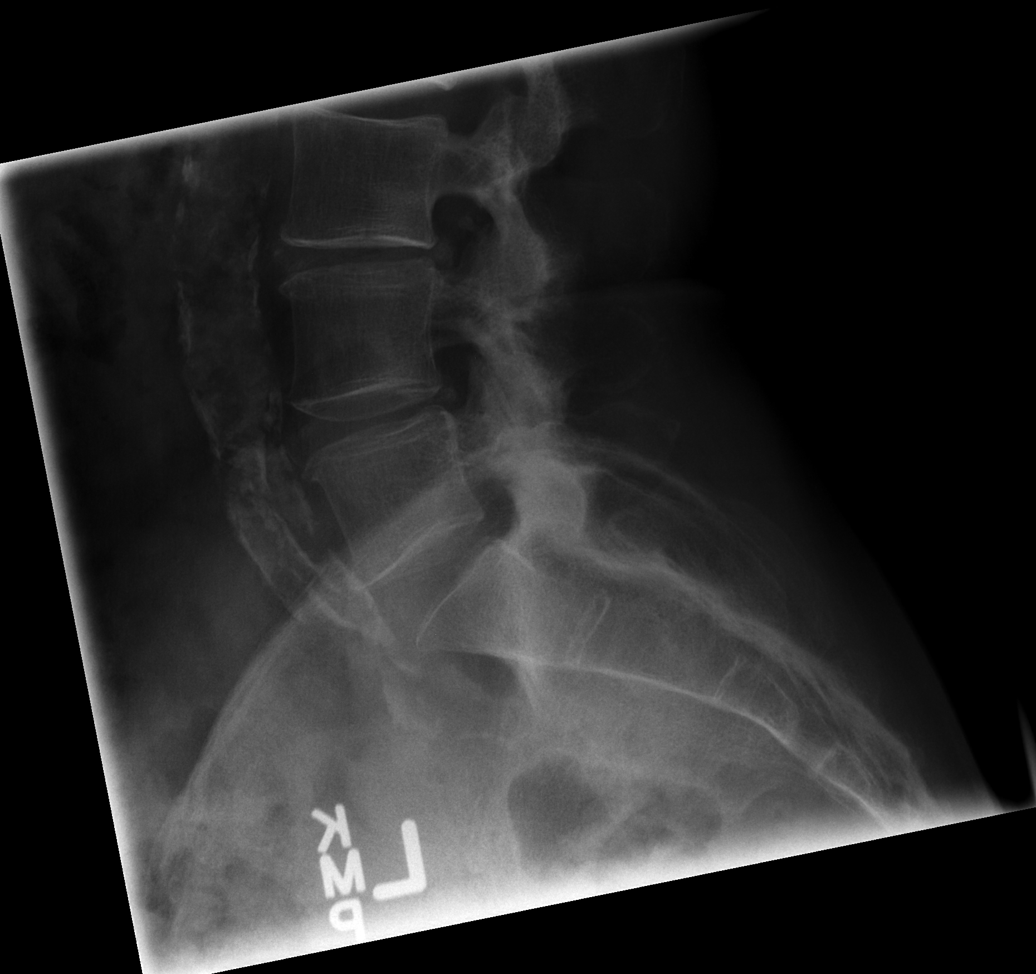

[5 of 5 positions shown; findings below may reference images not displayed]

FINDINGS: Five non rib-bearing lumbar vertebrae with anatomic alignment. No
fractures. Moderate disc space narrowing at L3-4 with mild endplate
hypertrophic changes. Calcification in the annular fibers of the
L3-4 disc with evidence of disc protrusion or extrusion. Mild disc
space narrowing and endplate hypertrophic changes at L4-5. No pars
defects. Facet degenerative changes bilaterally at L5-S1. Sacroiliac
joints intact.

Severe aortoiliac and visceral artery atherosclerosis without
aneurysm.
IMPRESSION: 1. No acute or subacute osseous abnormality.
2. Moderate degenerative disc disease at L3-4 with chronic disc
protrusion as evidenced by calcification in the annular fibers.
3. Mild degenerative disc disease and spondylosis at L4-5.
4. Facet degenerative changes bilaterally at L5-S1.
5. Severe aortoiliac and visceral artery atherosclerosis without
aneurysm.

## 2016-11-08 ENCOUNTER — Inpatient Hospital Stay (HOSPITAL_COMMUNITY): Admission: RE | Admit: 2016-11-08 | Payer: Self-pay | Source: Ambulatory Visit

## 2016-11-08 ENCOUNTER — Encounter (HOSPITAL_COMMUNITY): Payer: Self-pay

## 2017-06-10 IMAGING — CR DG CHEST 2V
2 series · 2 of 2 positions shown · non-contrast
Comparison: PA and lateral chest x-ray August 19, 2009

CLINICAL DATA: Preoperative examination prior to surgical procedure
for claudication. History of hypertension and diabetes, current
smoker.

EXAM:
CHEST  2 VIEW

[w chest pa]
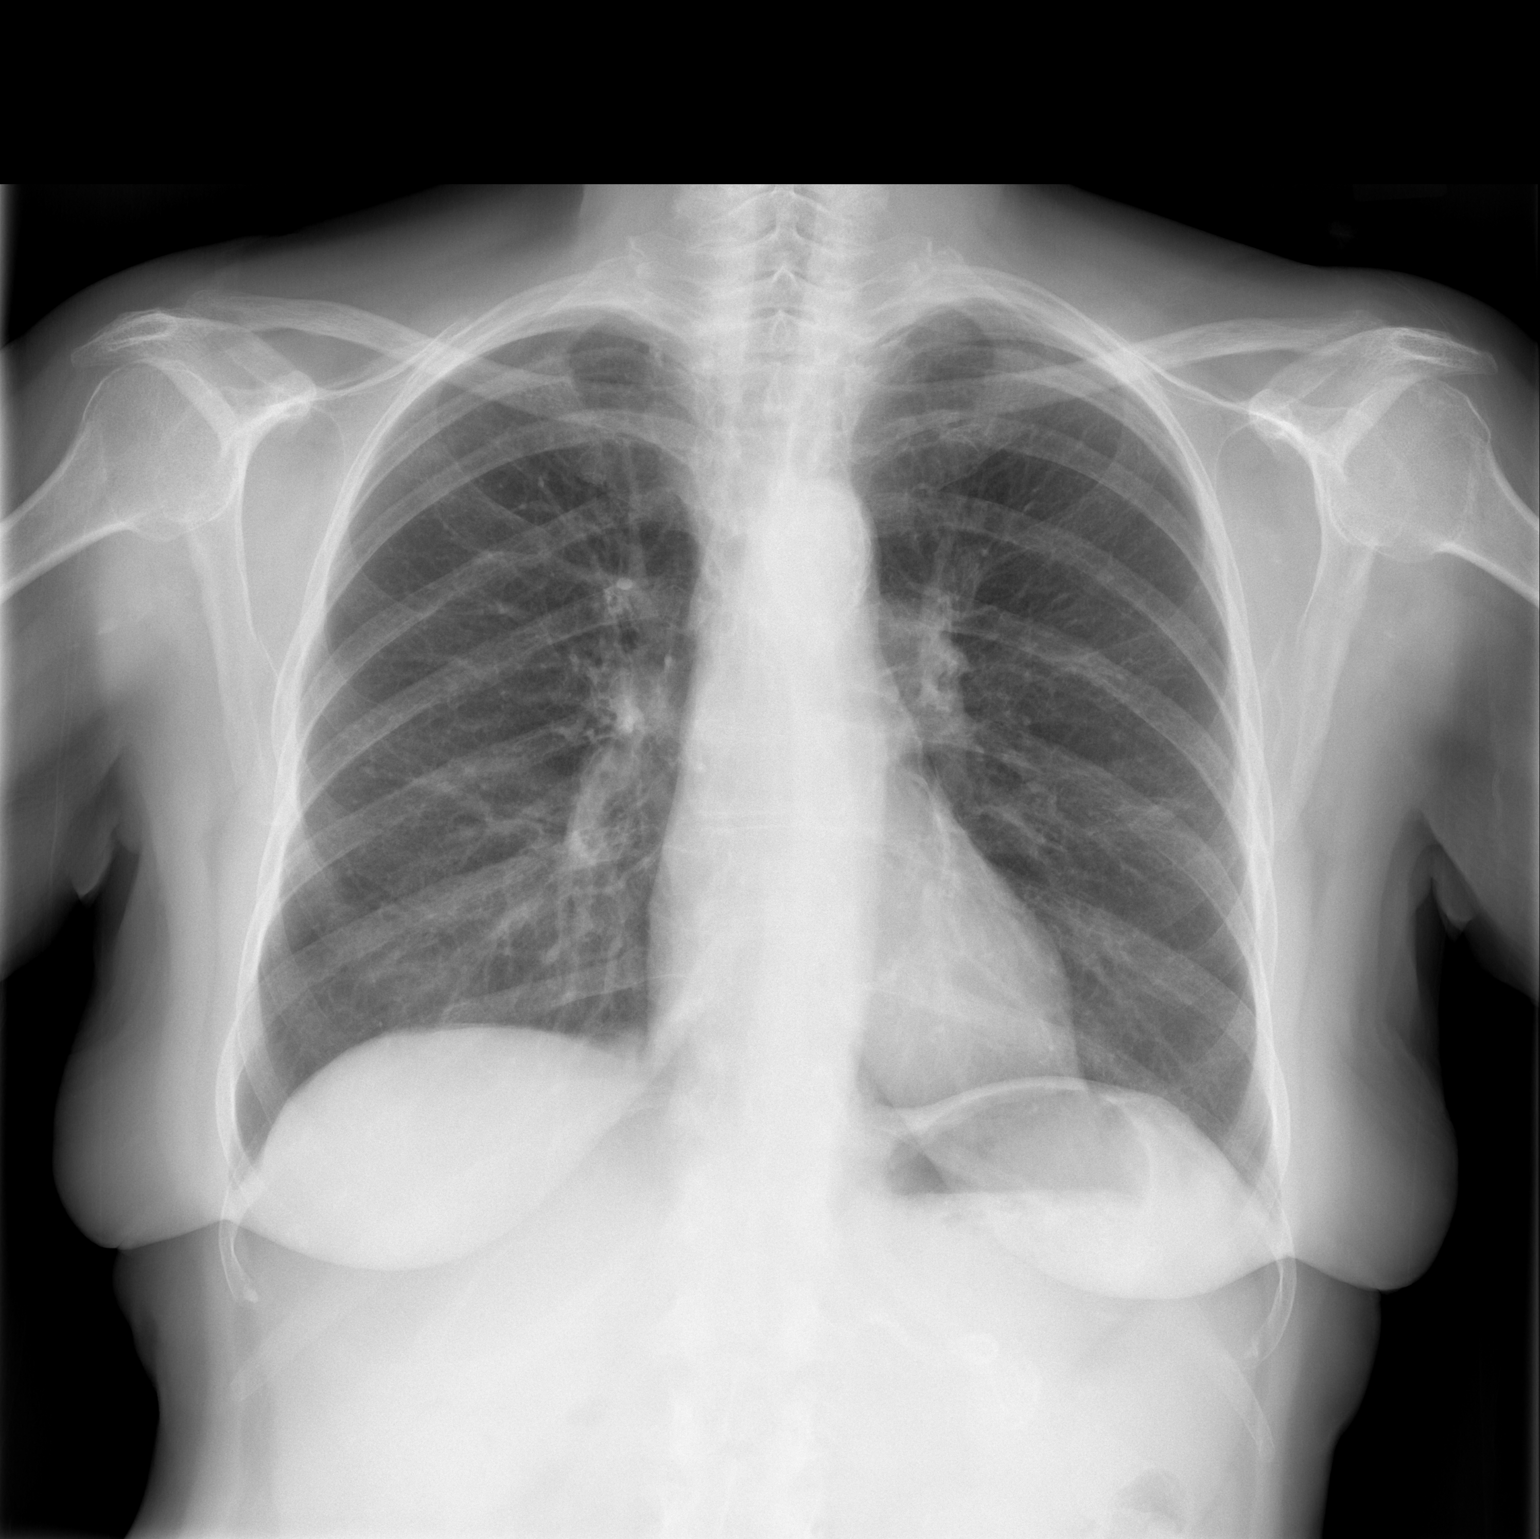

[w chest lat]
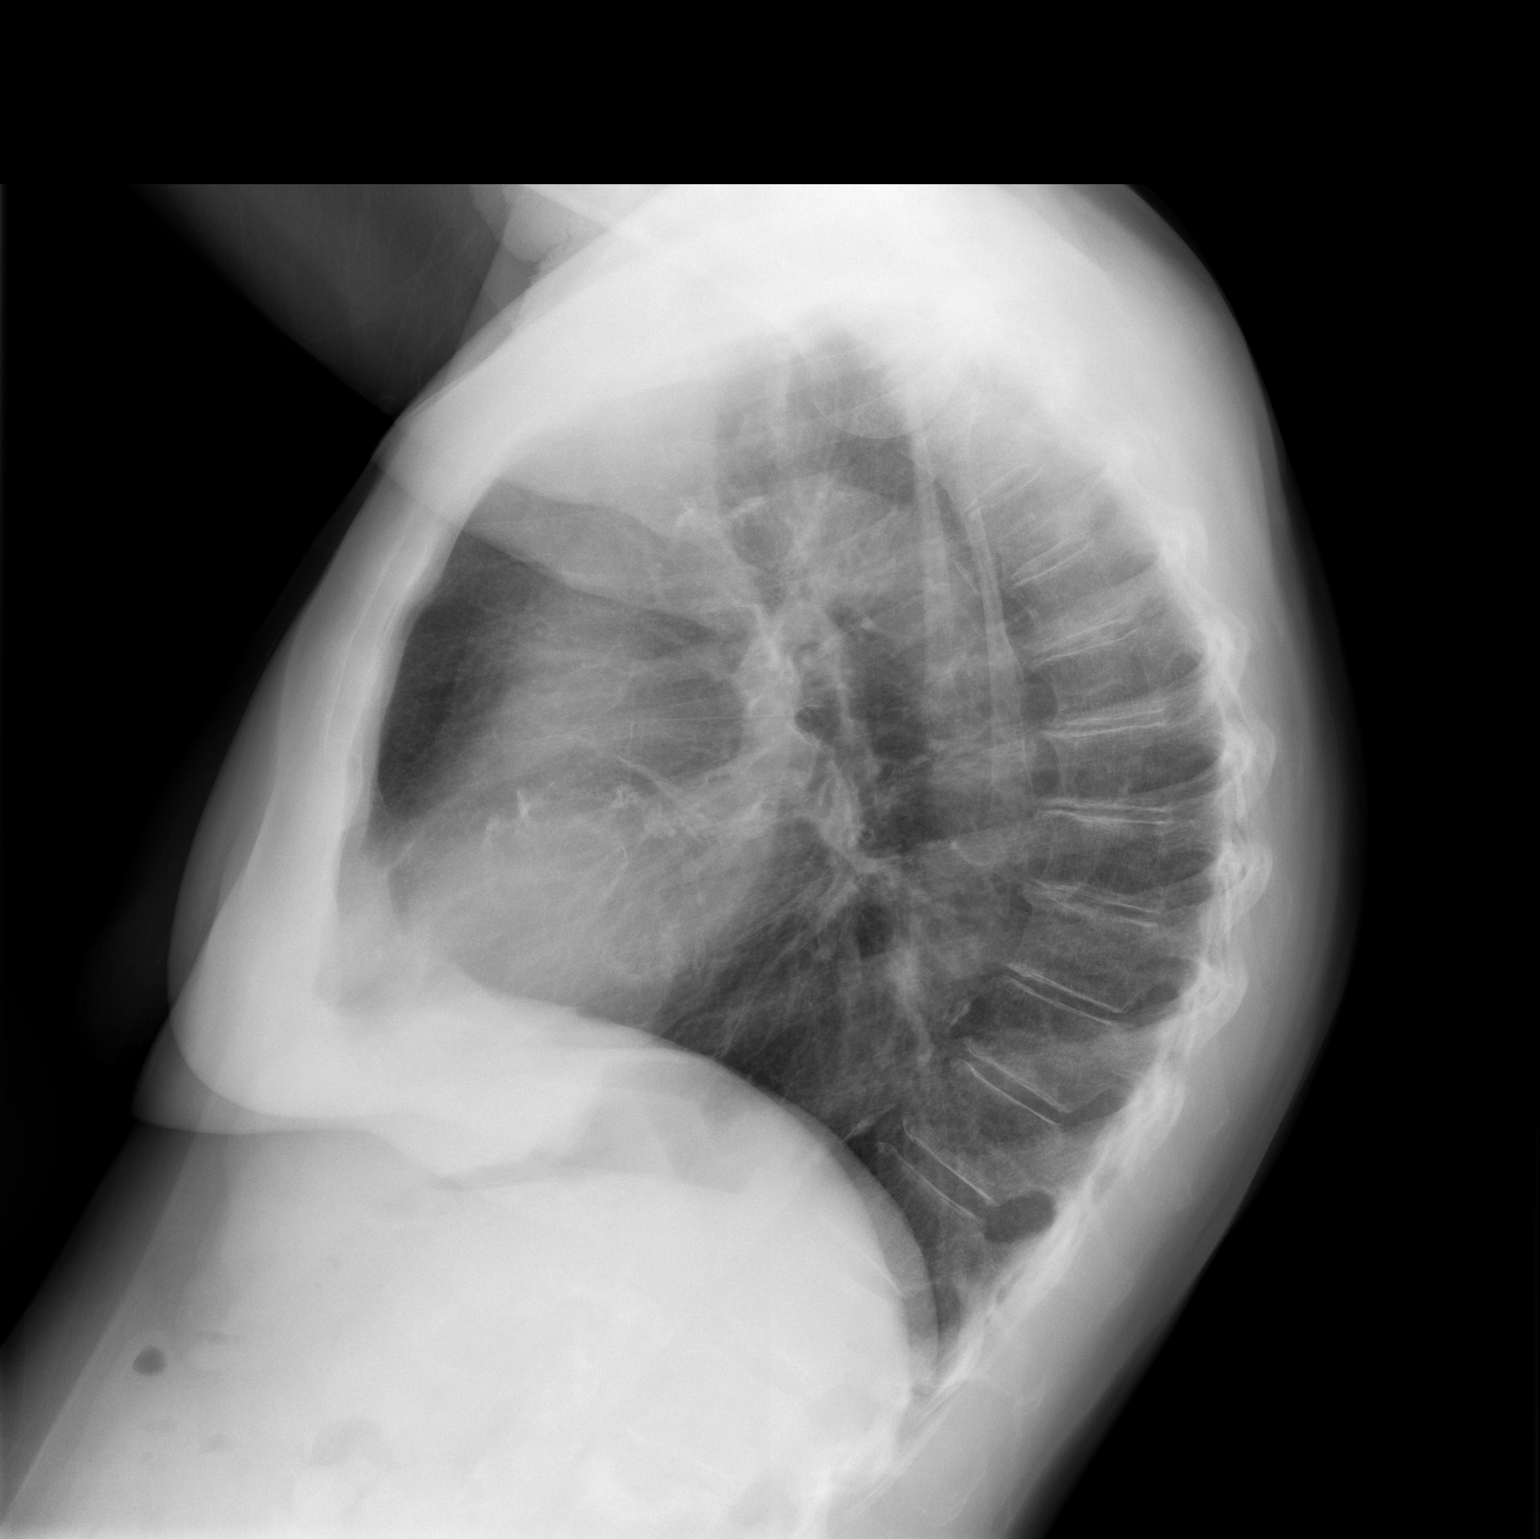

[2 of 2 positions shown; findings below may reference images not displayed]

FINDINGS: The lungs are adequately inflated. There is no focal infiltrate. The
interstitial markings are mildly prominent. The heart and pulmonary
vascularity are normal. Coronary artery calcifications and thoracic
aortic calcifications are observed. There is no pleural effusion.
There is mild multilevel degenerative disc disease of the thoracic
spine. There is atherosclerotic calcification in the region of the
left carotid bulb.
IMPRESSION: Mild chronic bronchitic -smoking related changes. No pneumonia nor
CHF.

Aortic and coronary artery calcifications consistent with
atherosclerosis. Left carotid bulb region calcifications also likely
reflect atherosclerosis.
